# Patient Record
Sex: Female | Born: 1975
Health system: Southern US, Community
[De-identification: ages and names within clinical notes are randomized; demographics above are authoritative.]

## PROBLEM LIST (undated history)

## (undated) ENCOUNTER — Inpatient Hospital Stay (HOSPITAL_COMMUNITY): Admission: RE | Payer: BC Managed Care – PPO | Source: Ambulatory Visit

## (undated) DIAGNOSIS — D563 Thalassemia minor: Secondary | ICD-10-CM

## (undated) DIAGNOSIS — Z8619 Personal history of other infectious and parasitic diseases: Secondary | ICD-10-CM

## (undated) DIAGNOSIS — M1990 Unspecified osteoarthritis, unspecified site: Secondary | ICD-10-CM

## (undated) DIAGNOSIS — F419 Anxiety disorder, unspecified: Secondary | ICD-10-CM

## (undated) DIAGNOSIS — Z973 Presence of spectacles and contact lenses: Secondary | ICD-10-CM

## (undated) HISTORY — PX: FEMUR SURGERY: SHX943

## (undated) HISTORY — PX: HIP SURGERY: SHX245

## (undated) HISTORY — PX: KNEE SURGERY: SHX244

## (undated) HISTORY — DX: Personal history of other infectious and parasitic diseases: Z86.19

## (undated) HISTORY — DX: Thalassemia minor: D56.3

## (undated) HISTORY — PX: BARTHOLIN CYST MARSUPIALIZATION: SHX5383

---

## 2012-01-21 ENCOUNTER — Encounter (HOSPITAL_COMMUNITY): Payer: Self-pay | Admitting: Anesthesiology

## 2012-01-21 ENCOUNTER — Other Ambulatory Visit: Payer: Self-pay | Admitting: Obstetrics & Gynecology

## 2012-01-21 ENCOUNTER — Encounter (HOSPITAL_COMMUNITY)
Admission: RE | Disposition: A | Discharge status: Home / Self Care | Payer: Self-pay | Source: Ambulatory Visit | Attending: Obstetrics & Gynecology

## 2012-01-21 ENCOUNTER — Ambulatory Visit (HOSPITAL_COMMUNITY): Payer: BC Managed Care – PPO | Admitting: Anesthesiology

## 2012-01-21 ENCOUNTER — Encounter (HOSPITAL_COMMUNITY): Payer: Self-pay | Admitting: *Deleted

## 2012-01-21 ENCOUNTER — Ambulatory Visit (HOSPITAL_COMMUNITY)
Admission: RE | Admit: 2012-01-21 | Discharge: 2012-01-21 | Disposition: A | Discharge status: Home / Self Care | Payer: BC Managed Care – PPO | Source: Ambulatory Visit | Attending: Obstetrics & Gynecology | Admitting: Obstetrics & Gynecology

## 2012-01-21 DIAGNOSIS — O021 Missed abortion: Secondary | ICD-10-CM | POA: Insufficient documentation

## 2012-01-21 HISTORY — PX: DILATION AND EVACUATION: SHX1459

## 2012-01-21 LAB — CBC
HCT: 33.7 % — ABNORMAL LOW (ref 36.0–46.0)
Hemoglobin: 11 g/dL — ABNORMAL LOW (ref 12.0–15.0)
MCH: 20.4 pg — ABNORMAL LOW (ref 26.0–34.0)
MCV: 62.4 fL — ABNORMAL LOW (ref 78.0–100.0)
RBC: 5.4 MIL/uL — ABNORMAL HIGH (ref 3.87–5.11)

## 2012-01-21 SURGERY — DILATION AND EVACUATION, UTERUS
Anesthesia: Monitor Anesthesia Care

## 2012-01-21 MED ORDER — METHYLERGONOVINE MALEATE 0.2 MG/ML IJ SOLN
INTRAMUSCULAR | Status: DC | PRN
  Administered 2012-01-21: 0.2 mg via INTRAMUSCULAR

## 2012-01-21 MED ORDER — FENTANYL CITRATE 0.05 MG/ML IJ SOLN
INTRAMUSCULAR | Status: AC
  Filled 2012-01-21: qty 2

## 2012-01-21 MED ORDER — FENTANYL CITRATE 0.05 MG/ML IJ SOLN: 25.0000 ug | INTRAMUSCULAR | Status: DC | PRN

## 2012-01-21 MED ORDER — LIDOCAINE HCL (CARDIAC) 20 MG/ML IV SOLN
INTRAVENOUS | Status: AC
  Filled 2012-01-21: qty 5

## 2012-01-21 MED ORDER — METHYLERGONOVINE MALEATE 0.2 MG PO TABS: 0.2000 mg | ORAL_TABLET | Freq: Two times a day (BID) | ORAL | Status: DC | PRN

## 2012-01-21 MED ORDER — LIDOCAINE HCL (CARDIAC) 20 MG/ML IV SOLN
INTRAVENOUS | Status: DC | PRN
  Administered 2012-01-21: 50 mg via INTRAVENOUS

## 2012-01-21 MED ORDER — LACTATED RINGERS IV SOLN
INTRAVENOUS | Status: DC
  Administered 2012-01-21: 14:00:00 via INTRAVENOUS

## 2012-01-21 MED ORDER — MEPERIDINE HCL 25 MG/ML IJ SOLN: 6.2500 mg | INTRAMUSCULAR | Status: DC | PRN

## 2012-01-21 MED ORDER — MIDAZOLAM HCL 5 MG/5ML IJ SOLN
INTRAMUSCULAR | Status: DC | PRN
  Administered 2012-01-21: 2 mg via INTRAVENOUS

## 2012-01-21 MED ORDER — IBUPROFEN 200 MG PO TABS: 600.0000 mg | ORAL_TABLET | Freq: Four times a day (QID) | ORAL | Status: AC | PRN

## 2012-01-21 MED ORDER — MIDAZOLAM HCL 2 MG/2ML IJ SOLN
INTRAMUSCULAR | Status: AC
  Filled 2012-01-21: qty 2

## 2012-01-21 MED ORDER — PROPOFOL 10 MG/ML IV EMUL
INTRAVENOUS | Status: AC
  Filled 2012-01-21: qty 40

## 2012-01-21 MED ORDER — CHLOROPROCAINE HCL 1 % IJ SOLN
INTRAMUSCULAR | Status: AC
  Filled 2012-01-21: qty 30

## 2012-01-21 MED ORDER — KETOROLAC TROMETHAMINE 30 MG/ML IJ SOLN
INTRAMUSCULAR | Status: DC | PRN
  Administered 2012-01-21: 30 mg via INTRAVENOUS

## 2012-01-21 MED ORDER — MIDAZOLAM HCL 2 MG/2ML IJ SOLN: 0.5000 mg | Freq: Once | INTRAMUSCULAR | Status: DC | PRN

## 2012-01-21 MED ORDER — ONDANSETRON HCL 4 MG/2ML IJ SOLN
INTRAMUSCULAR | Status: DC | PRN
  Administered 2012-01-21: 4 mg via INTRAVENOUS

## 2012-01-21 MED ORDER — ONDANSETRON HCL 4 MG/2ML IJ SOLN
INTRAMUSCULAR | Status: AC
  Filled 2012-01-21: qty 2

## 2012-01-21 MED ORDER — FENTANYL CITRATE 0.05 MG/ML IJ SOLN
INTRAMUSCULAR | Status: DC | PRN
  Administered 2012-01-21: 50 ug via INTRAVENOUS

## 2012-01-21 MED ORDER — KETOROLAC TROMETHAMINE 30 MG/ML IJ SOLN: 15.0000 mg | Freq: Once | INTRAMUSCULAR | Status: DC | PRN

## 2012-01-21 MED ORDER — PROMETHAZINE HCL 25 MG/ML IJ SOLN: 6.2500 mg | INTRAMUSCULAR | Status: DC | PRN

## 2012-01-21 MED ORDER — PROPOFOL 10 MG/ML IV EMUL
INTRAVENOUS | Status: DC | PRN
  Administered 2012-01-21 (×3): 40 mg via INTRAVENOUS
  Administered 2012-01-21 (×2): 80 mg via INTRAVENOUS
  Administered 2012-01-21: 40 mg via INTRAVENOUS

## 2012-01-21 MED ORDER — OXYCODONE-ACETAMINOPHEN 5-325 MG PO TABS: 1.0000 | ORAL_TABLET | Freq: Four times a day (QID) | ORAL | Status: AC | PRN

## 2012-01-21 MED ORDER — CEFAZOLIN SODIUM 1-5 GM-% IV SOLN
1.0000 g | INTRAVENOUS | Status: AC
  Administered 2012-01-21: 1 g via INTRAVENOUS

## 2012-01-21 MED ORDER — ACETAMINOPHEN 325 MG PO TABS: 325.0000 mg | ORAL_TABLET | ORAL | Status: DC | PRN

## 2012-01-21 SURGICAL SUPPLY — 19 items
CATH ROBINSON RED A/P 16FR (CATHETERS) ×2 IMPLANT
CLOTH BEACON ORANGE TIMEOUT ST (SAFETY) ×2 IMPLANT
DECANTER SPIKE VIAL GLASS SM (MISCELLANEOUS) ×2 IMPLANT
GLOVE BIO SURGEON STRL SZ7 (GLOVE) ×2 IMPLANT
GLOVE BIOGEL PI IND STRL 7.0 (GLOVE) ×2 IMPLANT
GLOVE BIOGEL PI INDICATOR 7.0 (GLOVE) ×2
GOWN PREVENTION PLUS LG XLONG (DISPOSABLE) ×2 IMPLANT
KIT BERKELEY 1ST TRIMESTER 3/8 (MISCELLANEOUS) ×2 IMPLANT
NEEDLE SPNL 22GX3.5 QUINCKE BK (NEEDLE) ×2 IMPLANT
NS IRRIG 1000ML POUR BTL (IV SOLUTION) ×2 IMPLANT
PACK VAGINAL MINOR WOMEN LF (CUSTOM PROCEDURE TRAY) ×2 IMPLANT
PAD PREP 24X48 CUFFED NSTRL (MISCELLANEOUS) ×2 IMPLANT
SET BERKELEY SUCTION TUBING (SUCTIONS) ×2 IMPLANT
SYR CONTROL 10ML LL (SYRINGE) ×2 IMPLANT
TOWEL OR 17X24 6PK STRL BLUE (TOWEL DISPOSABLE) ×4 IMPLANT
VACURETTE 10 RIGID CVD (CANNULA) IMPLANT
VACURETTE 7MM CVD STRL WRAP (CANNULA) IMPLANT
VACURETTE 8 RIGID CVD (CANNULA) IMPLANT
VACURETTE 9 RIGID CVD (CANNULA) IMPLANT

## 2012-01-21 NOTE — Op Note (Signed)
Preoperative diagnosis: Missed abortion 9 wks, anechoic shadows in fetal lungs Postop diagnosis: as above.  Procedure: Suction D&E Anesthesia General/ MAC. Surgeon: Shea Evans, MD Assistant: none  IV fluids : LR 1 lit Estimated blood loss  150 cc Urine output:  straight catheter preop Complications none Condition stable Disposition PACU  Specimen: products of conception sent to pathology and for chromosomal analysis.   Procedure:  Indication: 9 wks Missed abortion diagnosed in office today.  Decision was made to proceed with suction D&E. Risks/ complications of surgery including infection, bleeding, damage to internal organs including uterine perforation, Asherman syndrome were reviewed. She voiced understanding and gave informed written consent.  Patient was brought to the operating room with IV running. She received preop  1 gm Ancef.  She underwent general endotracheal anesthesia without complications. She was given dorsolithotomy position. Parts were prepped and draped in standard fashion. Bladder was catheterized once.  Bimanual exam revealed uterus to be anteverted and 10 week size.  Speculum was placed and cervix was grasped with single-tooth tenaculum. Paracervical block given with 10 cc 1% Nesacaine. Cervical os was dilated to 25  [place] dilator. A # 8 suction curette was introduced in the uterine cavity and suction evacuation of products of conception was done in multiple step wise fashion. Gentle sharp curettage was performed. Suction cannula was reintroduced. Uterine cavity felt boggy and 0.2 mg IM Methergine given. Bleeding was well controlled.  Procedure was complete.  All counts are correct x2.  No complications. Patient was made supine dorsal anesthesia and brought to the recovery room in stable condition.  Patient will be discharged home today. Dc meds Percocet, methergine PO(PRN 2-3 days)  Dc follow up in 2 weeks in office. Warning signs of infection and excessive bleeding  reviewed.  V.Senon Nixon, MD.

## 2012-01-21 NOTE — Discharge Instructions (Signed)
DISCHARGE INSTRUCTIONS: D&C / D&E The following instructions have been prepared to help you care for yourself upon your return home.  MAY TAKE IBUPROFEN AFTER 10:35PM IF NEEDED FOR CRAMPS   Personal hygiene: Marland Kitchen Use sanitary pads for vaginal drainage, not tampons. . Shower the day after your procedure. . NO tub baths, pools or Jacuzzis for 2-3 weeks. . Wipe front to back after using the bathroom.  Activity and limitations: . Do NOT drive or operate any equipment for 24 hours. The effects of anesthesia are still present and drowsiness may result. . Do NOT rest in bed all day. . Walking is encouraged. . Walk up and down stairs slowly. . You may resume your normal activity in one to two days or as indicated by your physician.  Sexual activity: NO intercourse for at least 2 weeks after the procedure, or as indicated by your physician.  Diet: Eat a light meal as desired this evening. You may resume your usual diet tomorrow.  Return to work: You may resume your work activities in one to two days or as indicated by your doctor.  What to expect after your surgery: Expect to have vaginal bleeding/discharge for 2-3 days and spotting for up to 10 days. It is not unusual to have soreness for up to 1-2 weeks. You may have a slight burning sensation when you urinate for the first day. Mild cramps may continue for a couple of days. You may have a regular period in 2-6 weeks.  Call your doctor for any of the following: . Excessive vaginal bleeding, saturating and changing one pad every hour. . Inability to urinate 6 hours after discharge from hospital. . Pain not relieved by pain medication. . Fever of 100.4 F or greater. . Unusual vaginal discharge or odor.  Post Anesthesia Care Unit [phone]

## 2012-01-21 NOTE — H&P (Signed)
Raven Phillips is an 36 y.o. female. Here for DE for missed abortion at 9 wks.  Has one full term baby 61 yo, then SAB for blighted ovum in Jul'12. No Gyn probl, no STDs, no ABn Paps, no breast complaints.   History reviewed. No pertinent past medical history.  Past Surgical History  Procedure Date  . Femur surgery     childhood  . Bartholin cyst marsupialization    History reviewed. No pertinent family history.  Social History:  does not have a smoking history on file. She does not have any smokeless tobacco history on file. She reports that she does not drink alcohol or use illicit drugs.  Allergies: No Known Allergies  No prescriptions prior to admission    Review of Systems  Constitutional: Negative for fever.  Eyes: Negative for blurred vision.  Gastrointestinal: Negative for heartburn.  Neurological: Negative for dizziness.    Blood pressure 149/99, pulse 81, temperature 98.6 F (37 C), temperature source Oral, resp. rate 18, height 5\' 3"  (1.6 m), weight 56.7 kg (125 lb), SpO2 100.00%. Physical Exam  A&O x 3, no acute distress. Pleasant HEENT neg, no thyromegaly Lungs CTA bilat CV RRR, S1S2 normal Abdo soft, non tender, non acute Extr no edema/ tenderness Pelvic def FHT  Absent on sono    Results for orders placed during the hospital encounter of 01/21/12 (from the past 24 hour(s))  CBC     Status: Abnormal   Collection Time   01/21/12  2:03 PM      Component Value Range   WBC 10.7 (*) 4.0 - 10.5 (K/uL)   RBC 5.40 (*) 3.87 - 5.11 (MIL/uL)   Hemoglobin 11.0 (*) 12.0 - 15.0 (g/dL)   HCT 16.1 (*) 09.6 - 46.0 (%)   MCV 62.4 (*) 78.0 - 100.0 (fL)   MCH 20.4 (*) 26.0 - 34.0 (pg)   MCHC 32.6  30.0 - 36.0 (g/dL)   RDW 04.5  40.9 - 81.1 (%)   Platelets 240  150 - 400 (K/uL)    No results found.  Assessment/Plan: 9 wks, Missed abortion, needs dilatation, evacuation. Plan to send for chromosomal analysis.   Risks/complications of surgery reviewed incl  infection, bleeding, damage to internal organs including bladder, bowels, ureters, blood vessels, other risks from anesthesia, VTE and delayed complications of any surgery, complications in future surgery reviewed.   Leiam Hopwood R 01/21/2012, 3:52 PM

## 2012-01-21 NOTE — Transfer of Care (Signed)
Immediate Anesthesia Transfer of Care Note  Patient: Raven Phillips  Procedure(s) Performed: Procedure(s) (LRB): DILATATION AND EVACUATION (N/A)  Patient Location: PACU  Anesthesia Type: MAC  Level of Consciousness: awake, alert  and oriented  Airway & Oxygen Therapy: Patient Spontanous Breathing  Post-op Assessment: Report given to PACU RN and Post -op Vital signs reviewed and stable  Post vital signs: Reviewed and stable  Complications: No apparent anesthesia complications

## 2012-01-21 NOTE — Anesthesia Preprocedure Evaluation (Signed)
Anesthesia Evaluation  Patient identified by MRN, date of birth, ID band Patient awake    Reviewed: Allergy & Precautions, H&P , Patient's Chart, lab work & pertinent test results, reviewed documented beta blocker date and time   History of Anesthesia Complications Negative for: history of anesthetic complications  Airway Mallampati: II TM Distance: >3 FB Neck ROM: full    Dental No notable dental hx.    Pulmonary neg pulmonary ROS,  breath sounds clear to auscultation  Pulmonary exam normal       Cardiovascular Exercise Tolerance: Good negative cardio ROS  Rhythm:regular Rate:Normal     Neuro/Psych negative neurological ROS  negative psych ROS   GI/Hepatic negative GI ROS, Neg liver ROS,   Endo/Other  negative endocrine ROS  Renal/GU negative Renal ROS     Musculoskeletal   Abdominal   Peds  Hematology negative hematology ROS (+)   Anesthesia Other Findings   Reproductive/Obstetrics negative OB ROS                           Anesthesia Physical Anesthesia Plan  ASA: I  Anesthesia Plan: MAC   Post-op Pain Management:    Induction:   Airway Management Planned:   Additional Equipment:   Intra-op Plan:   Post-operative Plan:   Informed Consent: I have reviewed the patients History and Physical, chart, labs and discussed the procedure including the risks, benefits and alternatives for the proposed anesthesia with the patient or authorized representative who has indicated his/her understanding and acceptance.   Dental Advisory Given  Plan Discussed with: CRNA, Surgeon and Anesthesiologist  Anesthesia Plan Comments:       Anesthesia Quick Evaluation  

## 2012-01-21 NOTE — Anesthesia Postprocedure Evaluation (Signed)
Anesthesia Post Note  Patient: Raven Phillips  Procedure(s) Performed: Procedure(s) (LRB): DILATATION AND EVACUATION (N/A)  Anesthesia type: MAC  Patient location: PACU  Post pain: Pain level controlled  Post assessment: Post-op Vital signs reviewed  Last Vitals:  Filed Vitals:   01/21/12 1730  BP: 105/75  Pulse: 61  Temp: 37.1 C  Resp: 16    Post vital signs: Reviewed  Level of consciousness: sedated  Complications: No apparent anesthesia complications

## 2012-01-22 ENCOUNTER — Encounter (HOSPITAL_COMMUNITY): Payer: Self-pay | Admitting: Obstetrics & Gynecology

## 2012-02-02 LAB — CHROMOSOME STD, POC(TISSUE)-NCBH

## 2012-02-09 DEATH — deceased

## 2012-06-03 LAB — OB RESULTS CONSOLE GC/CHLAMYDIA: Chlamydia: NEGATIVE

## 2012-06-17 LAB — OB RESULTS CONSOLE ABO/RH: RH Type: POSITIVE

## 2012-06-17 LAB — OB RESULTS CONSOLE RUBELLA ANTIBODY, IGM: Rubella: IMMUNE

## 2012-06-17 LAB — OB RESULTS CONSOLE HIV ANTIBODY (ROUTINE TESTING): HIV: NONREACTIVE

## 2012-06-17 LAB — OB RESULTS CONSOLE HEPATITIS B SURFACE ANTIGEN: Hepatitis B Surface Ag: NEGATIVE

## 2012-11-06 ENCOUNTER — Inpatient Hospital Stay (HOSPITAL_COMMUNITY)
Admission: AD | Admit: 2012-11-06 | Discharge: 2012-11-06 | Disposition: A | Discharge status: Home / Self Care | Payer: BC Managed Care – PPO | Source: Ambulatory Visit | Attending: Obstetrics | Admitting: Obstetrics

## 2012-11-06 ENCOUNTER — Encounter (HOSPITAL_COMMUNITY): Payer: Self-pay | Admitting: *Deleted

## 2012-11-06 DIAGNOSIS — O479 False labor, unspecified: Secondary | ICD-10-CM | POA: Insufficient documentation

## 2012-11-06 DIAGNOSIS — O99891 Other specified diseases and conditions complicating pregnancy: Secondary | ICD-10-CM | POA: Insufficient documentation

## 2012-11-06 LAB — AMNISURE RUPTURE OF MEMBRANE (ROM) NOT AT ARMC: Amnisure ROM: NEGATIVE

## 2012-11-06 NOTE — MAU Provider Note (Signed)
  History   Patient had called the answering service and was advised to come to hospital. Contacted the patient directly and patient gave good story of rupture. Advised to come to MAU for Evaluation of possible SROM. G 4p1021 at [redacted]w[redacted]d.   CSN: [id number]  Arrival date and time: 11/06/12 1159   None     Chief Complaint  Patient presents with  . Rupture of Membranes   HPI  OB History    Grav Para Term Preterm Abortions TAB SAB Ect Mult Living   4 1 1  0 2 0 2 0 0 1      History reviewed. No pertinent past medical history.  Past Surgical History  Procedure Date  . Femur surgery     childhood  . Bartholin cyst marsupialization   . Dilation and evacuation 01/21/2012    Procedure: DILATATION AND EVACUATION;  Surgeon: Robley Fries, MD;  Location: [facility];  Service: Gynecology;  Laterality: N/A;  chromosome studies    History reviewed. No pertinent family history.  History  Substance Use Topics  . Smoking status: Never Smoker   . Smokeless tobacco: Not on file  . Alcohol Use: No    Allergies: No Known Allergies  Prescriptions prior to admission  Medication Sig Dispense Refill  . methylergonovine (METHERGINE) 0.2 MG tablet Take 1 tablet (0.2 mg total) by mouth every 12 (twelve) hours as needed.  6 tablet  0    Review of Systems  Constitutional: Negative.   HENT: Negative.   Eyes: Negative.   Respiratory: Negative.   Cardiovascular: Negative.   Gastrointestinal: Negative.   Genitourinary: Negative.        Possible rupture of membranes. Evaluation.  Musculoskeletal: Negative.   Skin: Negative.        Multiple scars noted on legs baterally from orthopedic surgery to fix Hip Dyplasia.  Neurological: Negative.   Endo/Heme/Allergies: Negative.   Psychiatric/Behavioral: Negative.    Physical Exam   Blood pressure 131/75, pulse 101, temperature 98.2 F (36.8 C), temperature source Oral, resp. rate 18, height 5\' 3"  (1.6 m), weight 70.035 kg (154 lb 6.4  oz).  Physical Exam  Constitutional: She is oriented to person, place, and time. She appears well-developed and well-nourished.  HENT:  Head: Normocephalic and atraumatic.  Eyes: Conjunctivae normal and EOM are normal. Pupils are equal, round, and reactive to light.  Neck: Normal range of motion. Neck supple.  Cardiovascular: Normal rate and regular rhythm.   Respiratory: Effort normal and breath sounds normal.  GI: Soft. Bowel sounds are normal.  Genitourinary: Vagina normal and uterus normal.       Possible SROM and Amni-sure performed.  Musculoskeletal: Normal range of motion.  Neurological: She is alert and oriented to person, place, and time. She has normal reflexes.  Skin: Skin is warm and dry.  Psychiatric: She has a normal mood and affect.    MAU Course  Procedures EFM: 145 bpm baseline, Cat 1 Tracing. Contracting 1: 2 -3 mins. Amnio-sure:Negative.  Assessment and Plan  Patient is not ruptured with negative Amni-sure. Patient is contracting 1: 2 -3 mins c/w with BH Ctx as patient is not distressed. Has a scheduled appointment with Dr. Juliene Pina on 11/12/12.  Eather Chaires, CNM. 11/06/2012, 12:45 PM

## 2012-11-06 NOTE — MAU Note (Signed)
Pt reports she thinks her water broke. Pajama bottoms where wet ans still having some fluid come out. A Few mild contractions and good fetal movement reproted.

## 2012-11-10 NOTE — L&D Delivery Note (Signed)
Delivery Note At 5:31 PM a viable and healthy female was delivered via Vaginal, Spontaneous Delivery (Presentation: Right Occiput Anterior).  APGAR: 9, 9; weight - pending Placenta status: Intact, Spontaneous.  Cord: 3 vessels with the following complications: None.  Cord pH: n/a  Anesthesia: Epidural  Episiotomy: None Lacerations: 1st degree Suture Repair: 3.0 vicryl rapide Est. Blood Loss (mL): 300 cc  Mom to postpartum.  Baby to nursery-stable.  Dacen Frayre R 11/22/2012, 6:06 PM

## 2012-11-19 ENCOUNTER — Other Ambulatory Visit: Payer: Self-pay | Admitting: Obstetrics & Gynecology

## 2012-11-22 ENCOUNTER — Inpatient Hospital Stay (HOSPITAL_COMMUNITY): Payer: BC Managed Care – PPO | Admitting: Anesthesiology

## 2012-11-22 ENCOUNTER — Encounter (HOSPITAL_COMMUNITY): Payer: Self-pay

## 2012-11-22 ENCOUNTER — Encounter (HOSPITAL_COMMUNITY): Payer: Self-pay | Admitting: Anesthesiology

## 2012-11-22 ENCOUNTER — Inpatient Hospital Stay (HOSPITAL_COMMUNITY)
Admission: RE | Admit: 2012-11-22 | Discharge: 2012-11-24 | DRG: 373 | Disposition: A | Discharge status: Home / Self Care | Payer: BC Managed Care – PPO | Source: Ambulatory Visit | Attending: Obstetrics & Gynecology | Admitting: Obstetrics & Gynecology

## 2012-11-22 DIAGNOSIS — O9903 Anemia complicating the puerperium: Secondary | ICD-10-CM | POA: Diagnosis not present

## 2012-11-22 DIAGNOSIS — O99892 Other specified diseases and conditions complicating childbirth: Secondary | ICD-10-CM | POA: Diagnosis present

## 2012-11-22 DIAGNOSIS — O09529 Supervision of elderly multigravida, unspecified trimester: Secondary | ICD-10-CM | POA: Diagnosis present

## 2012-11-22 DIAGNOSIS — O021 Missed abortion: Secondary | ICD-10-CM

## 2012-11-22 DIAGNOSIS — D62 Acute posthemorrhagic anemia: Secondary | ICD-10-CM | POA: Diagnosis not present

## 2012-11-22 DIAGNOSIS — M25559 Pain in unspecified hip: Secondary | ICD-10-CM | POA: Diagnosis present

## 2012-11-22 LAB — CBC
MCH: 22 pg — ABNORMAL LOW (ref 26.0–34.0)
MCHC: 34.3 g/dL (ref 30.0–36.0)
Platelets: 235 10*3/uL (ref 150–400)

## 2012-11-22 LAB — TYPE AND SCREEN: ABO/RH(D): O POS

## 2012-11-22 LAB — ABO/RH: ABO/RH(D): O POS

## 2012-11-22 MED ORDER — LIDOCAINE HCL (PF) 1 % IJ SOLN: 30.0000 mL | INTRAMUSCULAR | Status: DC | PRN

## 2012-11-22 MED ORDER — CITRIC ACID-SODIUM CITRATE 334-500 MG/5ML PO SOLN: 30.0000 mL | ORAL | Status: DC | PRN

## 2012-11-22 MED ORDER — WITCH HAZEL-GLYCERIN EX PADS: 1.0000 "application " | MEDICATED_PAD | CUTANEOUS | Status: DC | PRN

## 2012-11-22 MED ORDER — BUTORPHANOL TARTRATE 1 MG/ML IJ SOLN
1.0000 mg | INTRAMUSCULAR | Status: DC | PRN
  Administered 2012-11-22: 1 mg via INTRAVENOUS
  Filled 2012-11-22: qty 1

## 2012-11-22 MED ORDER — OXYCODONE-ACETAMINOPHEN 5-325 MG PO TABS: 1.0000 | ORAL_TABLET | ORAL | Status: DC | PRN

## 2012-11-22 MED ORDER — LACTATED RINGERS IV SOLN: 500.0000 mL | INTRAVENOUS | Status: DC | PRN

## 2012-11-22 MED ORDER — LIDOCAINE HCL (PF) 1 % IJ SOLN
INTRAMUSCULAR | Status: DC | PRN
  Administered 2012-11-22 (×4): 4 mL

## 2012-11-22 MED ORDER — TETANUS-DIPHTH-ACELL PERTUSSIS 5-2.5-18.5 LF-MCG/0.5 IM SUSP: 0.5000 mL | Freq: Once | INTRAMUSCULAR | Status: DC

## 2012-11-22 MED ORDER — SIMETHICONE 80 MG PO CHEW: 80.0000 mg | CHEWABLE_TABLET | ORAL | Status: DC | PRN

## 2012-11-22 MED ORDER — IBUPROFEN 600 MG PO TABS: 600.0000 mg | ORAL_TABLET | Freq: Four times a day (QID) | ORAL | Status: DC | PRN

## 2012-11-22 MED ORDER — IBUPROFEN 600 MG PO TABS
600.0000 mg | ORAL_TABLET | Freq: Four times a day (QID) | ORAL | Status: DC
  Administered 2012-11-22 – 2012-11-24 (×6): 600 mg via ORAL
  Filled 2012-11-22 (×7): qty 1

## 2012-11-22 MED ORDER — DIPHENHYDRAMINE HCL 25 MG PO CAPS: 25.0000 mg | ORAL_CAPSULE | Freq: Four times a day (QID) | ORAL | Status: DC | PRN

## 2012-11-22 MED ORDER — ONDANSETRON HCL 4 MG/2ML IJ SOLN: 4.0000 mg | Freq: Four times a day (QID) | INTRAMUSCULAR | Status: DC | PRN

## 2012-11-22 MED ORDER — OXYTOCIN 40 UNITS IN LACTATED RINGERS INFUSION - SIMPLE MED
1.0000 m[IU]/min | INTRAVENOUS | Status: DC
  Administered 2012-11-22: 2 m[IU]/min via INTRAVENOUS
  Filled 2012-11-22: qty 1000

## 2012-11-22 MED ORDER — LANOLIN HYDROUS EX OINT: TOPICAL_OINTMENT | CUTANEOUS | Status: DC | PRN

## 2012-11-22 MED ORDER — OXYTOCIN BOLUS FROM INFUSION: 500.0000 mL | INTRAVENOUS | Status: DC

## 2012-11-22 MED ORDER — PHENYLEPHRINE 40 MCG/ML (10ML) SYRINGE FOR IV PUSH (FOR BLOOD PRESSURE SUPPORT): 80.0000 ug | PREFILLED_SYRINGE | INTRAVENOUS | Status: DC | PRN

## 2012-11-22 MED ORDER — ZOLPIDEM TARTRATE 5 MG PO TABS: 5.0000 mg | ORAL_TABLET | Freq: Every evening | ORAL | Status: DC | PRN

## 2012-11-22 MED ORDER — SENNOSIDES-DOCUSATE SODIUM 8.6-50 MG PO TABS
2.0000 | ORAL_TABLET | Freq: Every day | ORAL | Status: DC
  Administered 2012-11-23: 2 via ORAL

## 2012-11-22 MED ORDER — FENTANYL 2.5 MCG/ML BUPIVACAINE 1/10 % EPIDURAL INFUSION (WH - ANES)
14.0000 mL/h | INTRAMUSCULAR | Status: DC
  Administered 2012-11-22: 14 mL/h via EPIDURAL
  Filled 2012-11-22: qty 125

## 2012-11-22 MED ORDER — FLEET ENEMA 7-19 GM/118ML RE ENEM: 1.0000 | ENEMA | RECTAL | Status: DC | PRN

## 2012-11-22 MED ORDER — ONDANSETRON HCL 4 MG PO TABS: 4.0000 mg | ORAL_TABLET | ORAL | Status: DC | PRN

## 2012-11-22 MED ORDER — PRENATAL MULTIVITAMIN CH
1.0000 | ORAL_TABLET | Freq: Every day | ORAL | Status: DC
  Administered 2012-11-22 – 2012-11-24 (×3): 1 via ORAL
  Filled 2012-11-22 (×2): qty 1

## 2012-11-22 MED ORDER — LACTATED RINGERS IV SOLN: 500.0000 mL | Freq: Once | INTRAVENOUS | Status: DC

## 2012-11-22 MED ORDER — DIPHENHYDRAMINE HCL 50 MG/ML IJ SOLN: 12.5000 mg | INTRAMUSCULAR | Status: DC | PRN

## 2012-11-22 MED ORDER — ONDANSETRON HCL 4 MG/2ML IJ SOLN: 4.0000 mg | INTRAMUSCULAR | Status: DC | PRN

## 2012-11-22 MED ORDER — PHENYLEPHRINE 40 MCG/ML (10ML) SYRINGE FOR IV PUSH (FOR BLOOD PRESSURE SUPPORT)
80.0000 ug | PREFILLED_SYRINGE | INTRAVENOUS | Status: DC | PRN
  Filled 2012-11-22: qty 5

## 2012-11-22 MED ORDER — TERBUTALINE SULFATE 1 MG/ML IJ SOLN: 0.2500 mg | Freq: Once | INTRAMUSCULAR | Status: DC | PRN

## 2012-11-22 MED ORDER — EPHEDRINE 5 MG/ML INJ
10.0000 mg | INTRAVENOUS | Status: DC | PRN
  Filled 2012-11-22: qty 4

## 2012-11-22 MED ORDER — BENZOCAINE-MENTHOL 20-0.5 % EX AERO
1.0000 "application " | INHALATION_SPRAY | CUTANEOUS | Status: DC | PRN
  Administered 2012-11-22: 1 via TOPICAL
  Filled 2012-11-22: qty 56

## 2012-11-22 MED ORDER — EPHEDRINE 5 MG/ML INJ: 10.0000 mg | INTRAVENOUS | Status: DC | PRN

## 2012-11-22 MED ORDER — OXYTOCIN 40 UNITS IN LACTATED RINGERS INFUSION - SIMPLE MED
62.5000 mL/h | INTRAVENOUS | Status: DC
Start: 2012-11-22 — End: 2012-11-22

## 2012-11-22 MED ORDER — LACTATED RINGERS IV SOLN
INTRAVENOUS | Status: DC
  Administered 2012-11-22 (×2): via INTRAVENOUS

## 2012-11-22 MED ORDER — ACETAMINOPHEN 325 MG PO TABS: 650.0000 mg | ORAL_TABLET | ORAL | Status: DC | PRN

## 2012-11-22 MED ORDER — DIBUCAINE 1 % RE OINT: 1.0000 "application " | TOPICAL_OINTMENT | RECTAL | Status: DC | PRN

## 2012-11-22 NOTE — H&P (Signed)
Raven Phillips is a 37 y.o. female [phone] at  39.2 wks here for labor IOL due to hip pain from hip dysplasia hx and s/p surgical correction.  No leaking/ bleeding. Good FMs and no UCs.  1st term preg, then 2 SAB,s w/up for 1st trim loss neg, took bb ASA in 1st trim,  [facility], no complications. Labs nl. Sono nl.   History OB History    Grav Para Term Preterm Abortions TAB SAB Ect Mult Living   4 1 1  0 2 0 2 0 0 1     No past medical history on file. Past Surgical History  Procedure Date  . Femur surgery     childhood  . Bartholin cyst marsupialization   . Dilation and evacuation 01/21/2012    Procedure: DILATATION AND EVACUATION;  Surgeon: Robley Fries, MD;  Location: [facility];  Service: Gynecology;  Laterality: N/A;  chromosome studies  . Hip surgery Childhood    For hip dysplasia  . Knee surgery childhhod   Family History: family history is not on file. Social History:  reports that she has never smoked. She has never used smokeless tobacco. She reports that she does not drink alcohol or use illicit drugs.   Prenatal Transfer Tool  Maternal Diabetes: No Genetic Screening: Normal- Ultrascreen negative, AFP1 nl.  Maternal Ultrasounds/Referrals: Normal Fetal Ultrasounds or other Referrals:  None Maternal Substance Abuse:  No Significant Maternal Medications:  Baby ASA in 1st trimester for prior 2 losses.  Significant Maternal Lab Results:  Lab values include: Group B Strep negative, Beta Thal- minor Other Comments:  Mother and 1st child had hip dysplasia at birth  ROS Hip pain(+), but no flank pain/ dysuria/ Cp/SOB/ HA.   Exam Physical Exam  Physical exam:  A&O x 3, no acute distress. Pleasant HEENT neg, no thyromegaly Lungs CTA bilat CV RRR, S1S2 normal Abdo soft, non tender, non acute Extr no edema/ tenderness. Cannot rotate/abduct left hip well Pelvic 2/50%/-3/Vtx FHT  130s/ + accels/ no decels/ mod variab- category I Toco none at admission  Prenatal  labs: ABO, Rh: --/--/O POS, O POS (01/13 0745) Antibody: NEG (01/13 0745) Rubella: Immune (08/08 0000) RPR: NON REACTIVE (01/13 0745)  HBsAg: Negative (08/08 0000)  HIV: Non-reactive (08/08 0000)  GBS: Negative (12/27 0000)  Glucola nl Anatomy sono nl Hemoglobin- Thal minor.   Assessment/Plan 37 yo, G4P1021 at 39.2 wks, IOL, pitocin. Epidural as desired. GBS neg. FHT category I Control legs during delivery since pt has h/o hip dysplasia since birth, s/p surgery but had a lot of pain after 1st delivery (pushed for 2+ hrs then) Mother is b-Thanl-minor    Odalis Jordan R 11/22/2012, 5:51 PM

## 2012-11-22 NOTE — Progress Notes (Signed)
Anicia Leuthold is a 37 y.o. [phone] at [redacted]w[redacted]d, IOL. GBS neg. Subjective: Feels some UCs, getting stronger, considering epidural   Objective: BP 131/69  Pulse 81  Temp 98.2 F (36.8 C) (Oral)  Resp 18  Ht 5\' 3"  (1.6 m)  Wt 157 lb (71.215 kg)  BMI 27.81 kg/m2  SpO2 98%   FHT:130s /+ accels/ no decels/ mod varia- category I  UC:   regular, every 3 minutes SVE:  3/ 50%/ -2/ Vtx. AROM, clear copious AF.  Assessment / Plan: Induction of labor due to Hip pain with prior hip dyaplasia,  progressing well on pitocin  Labor: Progressing normally Fetal Wellbeing:  Category I Pain Control:  Labor support without medications, epidural as desired Anticipated MOD:  NSVD  Gayleen Sholtz R 11/22/2012, 6:01 PM

## 2012-11-22 NOTE — Anesthesia Preprocedure Evaluation (Signed)

## 2012-11-22 NOTE — Progress Notes (Signed)
Care of pt turned over to Rogelio Seen, RN

## 2012-11-22 NOTE — Progress Notes (Signed)
Resumed care of pt from Rogelio Seen, RN

## 2012-11-22 NOTE — Anesthesia Procedure Notes (Addendum)
Epidural Patient location during procedure: OB Start time: 11/22/2012 3:30 PM  Staffing Performed by: anesthesiologist   Preanesthetic Checklist Completed: patient identified, site marked, surgical consent, pre-op evaluation, timeout performed, IV checked, risks and benefits discussed and monitors and equipment checked  Epidural Patient position: sitting Prep: site prepped and draped and DuraPrep Patient monitoring: continuous pulse ox and blood pressure Approach: midline Injection technique: LOR air  Needle:  Needle type: Tuohy  Needle gauge: 17 G Needle length: 9 cm and 9 Needle insertion depth: 5 cm cm Catheter type: closed end flexible Catheter size: 19 Gauge Catheter at skin depth: 10 cm Test dose: negative  Assessment Events: blood not aspirated, injection not painful, no injection resistance, negative IV test and no paresthesia  Additional Notes Discussed risk of headache, infection, bleeding, nerve injury and failed or incomplete block.  Patient voices understanding and wishes to proceed.  Epidural placed easily on first attempt.  Patient tolerated procedure well with no apparent complications. Reason for block:procedure for pain

## 2012-11-23 DIAGNOSIS — D62 Acute posthemorrhagic anemia: Secondary | ICD-10-CM | POA: Diagnosis not present

## 2012-11-23 LAB — CBC
HCT: 30.1 % — ABNORMAL LOW (ref 36.0–46.0)
Hemoglobin: 9.8 g/dL — ABNORMAL LOW (ref 12.0–15.0)
MCH: 21.6 pg — ABNORMAL LOW (ref 26.0–34.0)
MCHC: 32.6 g/dL (ref 30.0–36.0)

## 2012-11-23 NOTE — Anesthesia Postprocedure Evaluation (Signed)
  Anesthesia Post-op Note  Patient: Raven Phillips  Procedure(s) Performed: * No procedures listed *  Patient Location: Mother/Baby  Anesthesia Type:Epidural  Level of Consciousness: awake, alert  and oriented  Airway and Oxygen Therapy: Patient Spontanous Breathing  Post-op Pain: mild  Post-op Assessment: Patient's Cardiovascular Status Stable, Respiratory Function Stable, No headache, No backache, No residual numbness and No residual motor weakness  Post-op Vital Signs: stable  Complications: No apparent anesthesia complications

## 2012-11-23 NOTE — Progress Notes (Signed)
PPD 1 SVD  S:  Reports feeling well - slept after birth last evening             Tolerating po/ No nausea or vomiting             Bleeding is light this am - heavier last PM after birth for several hours             Pain controlled withprescription NSAID's including motrin and occasional percocet             Up ad lib / ambulatory  Newborn formula feeding     O:               VS: BP 107/67  Pulse 69  Temp 97.8 F (36.6 C) (Oral)  Resp 18  Ht 5\' 3"  (1.6 m)  Wt 71.215 kg (157 lb)  BMI 27.81 kg/m2  SpO2 97%  Breastfeeding? Unknown   LABS:  Basename 11/23/12 0555 11/22/12 0745  WBC 13.1* 10.4  HGB 9.8* 11.0*  PLT 156 235                   Physical Exam:             Alert and oriented X3  Lungs: Clear and unlabored  Heart: regular rate and rhythm / no mumurs  Abdomen: soft, non-tender, non-distended              Fundus: firm, non-tender, Fundus even  Perineum: mild edema / ice pack in place  Lochia: lighty  Extremities: trace edema, no calf pain or tenderness    A: PPD # 1              ABL associated with childbirth - hemodynamically stable  Doing well - stable status  P:  Routine post partum orders  Anticipate discharge in am   Marlinda Mike CNM, MSN 11/23/2012, 8:59 AM

## 2012-11-24 MED ORDER — IBUPROFEN 600 MG PO TABS: 600.0000 mg | ORAL_TABLET | Freq: Four times a day (QID) | ORAL | Status: DC

## 2012-11-24 NOTE — Progress Notes (Signed)
Patient ID: Raven Phillips, female   DOB: 1976-03-13, 37 y.o.   MRN: [id number] PPD # 2  Subjective: Pt reports feeling well and eager for d/c home/ Pain controlled with ibuprofen Tolerating po/ Voiding without problems/ No n/v Bleeding is light/ Newborn info:  Information for the patient's newborn:  Mckaylie, Vasey Girl Charlise [[id number]]  female Feeding: breast    Objective:  VS: Blood pressure 109/69, pulse 65, temperature 97.9 F (36.6 C), temperature source Oral, resp. rate 18.    Basename 11/23/12 0555 11/22/12 0745  WBC 13.1* 10.4  HGB 9.8* 11.0*  HCT 30.1* 32.1*  PLT 156 235    Blood type: --/--/O POS, O POS (01/13 0745) Rubella: Immune (08/08 0000)    Physical Exam:  General: A & O x 3  alert, cooperative and no distress CV: Regular rate and rhythm Resp: clear Abdomen: soft, nontender, normal bowel sounds Uterine Fundus: firm, below umbilicus, nontender Perineum: not inspected Lochia: minimal Ext: Homans sign is negative, no sign of DVT    A/P: PPD # 2/ [id number]/ S/P: SVD w/ 2nd deg repair Doing well and stable for discharge home RX: Ibuprofen 600mg  po Q 6 hrs prn pain #30 Refill x 1 WOB/GYN booklet given Routine pp visit in 6wks   Demetrius Revel, MSN, [facility] 11/24/2012, 9:33 AM

## 2012-11-24 NOTE — Discharge Summary (Signed)
Obstetric Discharge Summary Reason for Admission: 39w 2d for IOL; hx hip dysplasia since birth Prenatal Procedures: ultrasound Intrapartum Procedures: spontaneous vaginal delivery Postpartum Procedures: none Complications-Operative and Postpartum: 2nd degree perineal laceration Hemoglobin  Date Value Range Status  11/23/2012 9.8* 12.0 - 15.0 g/dL Final     HCT  Date Value Range Status  11/23/2012 30.1* 36.0 - 46.0 % Final    Physical Exam:  General: alert, cooperative and no distress Lochia: appropriate Uterine Fundus: firm Incision: n/a DVT Evaluation: No evidence of DVT seen on physical exam.  Discharge Diagnoses: SVD w/ 2nd degree lac  Discharge Information: Date: 11/24/2012 Activity: pelvic rest Diet: routine Medications: PNV and Ibuprofen Condition: stable Instructions: refer to practice specific booklet Discharge to: home Follow-up Information    Follow up with MODY,Raven Phillips, Raven Phillips. In 6 weeks.   Contact information:   [address] [place] [address] [phone]          Newborn Data: Live born female on 11/22/12 Birth Weight: 7 lb 5.1 oz (3320 g) APGAR: 9, 9  Home with mother.  Raven Phillips 11/24/2012, 9:41 AM

## 2012-11-25 NOTE — Discharge Summary (Signed)
Reviewed and agree with note and plan. V.Woodrow Drab, MD  

## 2013-11-10 NOTE — L&D Delivery Note (Signed)
Delivery Note At 4:43 PM a viable and healthy female was delivered via Vaginal, Spontaneous Delivery (Presentation: Right Occiput Anterior).  APGAR:9 , 9; weight  -pending   Placenta status: Intact, Spontaneous.  Cord: 3 vessels with the following complications: None.  Cord pH: n/a  Anesthesia: Epidural  Episiotomy: None Lacerations: 1st degree Suture Repair: 3.0 vicryl rapide Est. Blood Loss (mL): 400  Mom to postpartum.  Baby to Couplet care / Skin to Skin. Grunting resolved with skin/skin, kept nl O2 sats.   Luiza Carranco R 09/14/2014, 5:27 PM

## 2014-03-15 LAB — OB RESULTS CONSOLE ANTIBODY SCREEN: ANTIBODY SCREEN: NEGATIVE

## 2014-03-15 LAB — OB RESULTS CONSOLE HIV ANTIBODY (ROUTINE TESTING): HIV: NONREACTIVE

## 2014-03-15 LAB — OB RESULTS CONSOLE ABO/RH: RH Type: POSITIVE

## 2014-03-15 LAB — OB RESULTS CONSOLE RPR: RPR: NONREACTIVE

## 2014-03-15 LAB — OB RESULTS CONSOLE HEPATITIS B SURFACE ANTIGEN: Hepatitis B Surface Ag: NEGATIVE

## 2014-03-15 LAB — OB RESULTS CONSOLE RUBELLA ANTIBODY, IGM: Rubella: IMMUNE

## 2014-03-22 LAB — OB RESULTS CONSOLE GC/CHLAMYDIA
Chlamydia: NEGATIVE
Gonorrhea: NEGATIVE

## 2014-04-07 LAB — OB RESULTS CONSOLE GBS: GBS: POSITIVE

## 2014-09-08 ENCOUNTER — Other Ambulatory Visit: Payer: Self-pay | Admitting: Obstetrics & Gynecology

## 2014-09-08 ENCOUNTER — Telehealth (HOSPITAL_COMMUNITY): Payer: Self-pay | Admitting: *Deleted

## 2014-09-08 ENCOUNTER — Encounter (HOSPITAL_COMMUNITY): Payer: Self-pay | Admitting: *Deleted

## 2014-09-08 NOTE — Telephone Encounter (Signed)
Preadmission screen  

## 2014-09-11 ENCOUNTER — Encounter (HOSPITAL_COMMUNITY): Payer: Self-pay | Admitting: *Deleted

## 2014-09-14 ENCOUNTER — Encounter (HOSPITAL_COMMUNITY): Payer: Self-pay

## 2014-09-14 ENCOUNTER — Inpatient Hospital Stay (HOSPITAL_COMMUNITY): Payer: BC Managed Care – PPO | Admitting: Anesthesiology

## 2014-09-14 ENCOUNTER — Inpatient Hospital Stay (HOSPITAL_COMMUNITY)
Admission: RE | Admit: 2014-09-14 | Discharge: 2014-09-16 | DRG: 775 | Disposition: A | Discharge status: Home / Self Care | Payer: BC Managed Care – PPO | Source: Ambulatory Visit | Attending: Obstetrics & Gynecology | Admitting: Obstetrics & Gynecology

## 2014-09-14 DIAGNOSIS — Q6589 Other specified congenital deformities of hip: Secondary | ICD-10-CM | POA: Diagnosis not present

## 2014-09-14 DIAGNOSIS — O9902 Anemia complicating childbirth: Secondary | ICD-10-CM | POA: Diagnosis present

## 2014-09-14 DIAGNOSIS — Z8249 Family history of ischemic heart disease and other diseases of the circulatory system: Secondary | ICD-10-CM | POA: Diagnosis not present

## 2014-09-14 DIAGNOSIS — Z3A39 39 weeks gestation of pregnancy: Secondary | ICD-10-CM | POA: Diagnosis present

## 2014-09-14 DIAGNOSIS — O99824 Streptococcus B carrier state complicating childbirth: Secondary | ICD-10-CM | POA: Diagnosis present

## 2014-09-14 DIAGNOSIS — Z823 Family history of stroke: Secondary | ICD-10-CM | POA: Diagnosis not present

## 2014-09-14 DIAGNOSIS — O9989 Other specified diseases and conditions complicating pregnancy, childbirth and the puerperium: Secondary | ICD-10-CM | POA: Diagnosis present

## 2014-09-14 DIAGNOSIS — Z833 Family history of diabetes mellitus: Secondary | ICD-10-CM

## 2014-09-14 DIAGNOSIS — O09523 Supervision of elderly multigravida, third trimester: Secondary | ICD-10-CM | POA: Diagnosis not present

## 2014-09-14 DIAGNOSIS — D62 Acute posthemorrhagic anemia: Secondary | ICD-10-CM | POA: Diagnosis present

## 2014-09-14 DIAGNOSIS — M25559 Pain in unspecified hip: Secondary | ICD-10-CM

## 2014-09-14 DIAGNOSIS — G8929 Other chronic pain: Secondary | ICD-10-CM | POA: Diagnosis present

## 2014-09-14 LAB — CBC
HCT: 33.7 % — ABNORMAL LOW (ref 36.0–46.0)
HEMOGLOBIN: 11 g/dL — AB (ref 12.0–15.0)
MCH: 21.4 pg — AB (ref 26.0–34.0)
MCHC: 32.6 g/dL (ref 30.0–36.0)
MCV: 65.7 fL — ABNORMAL LOW (ref 78.0–100.0)
PLATELETS: 179 10*3/uL (ref 150–400)
RBC: 5.13 MIL/uL — AB (ref 3.87–5.11)
RDW: 15.6 % — ABNORMAL HIGH (ref 11.5–15.5)
WBC: 9.4 10*3/uL (ref 4.0–10.5)

## 2014-09-14 LAB — RPR

## 2014-09-14 MED ORDER — LIDOCAINE HCL (PF) 1 % IJ SOLN
30.0000 mL | INTRAMUSCULAR | Status: DC | PRN
Start: 2014-09-14 — End: 2014-09-16
  Administered 2014-09-14: 30 mL via SUBCUTANEOUS
  Filled 2014-09-14: qty 30

## 2014-09-14 MED ORDER — ACETAMINOPHEN 325 MG PO TABS: 650.0000 mg | ORAL_TABLET | ORAL | Status: DC | PRN

## 2014-09-14 MED ORDER — PRENATAL MULTIVITAMIN CH
1.0000 | ORAL_TABLET | Freq: Every day | ORAL | Status: DC
Start: 2014-09-15 — End: 2014-09-16
  Administered 2014-09-15 – 2014-09-16 (×2): 1 via ORAL
  Filled 2014-09-14 (×2): qty 1

## 2014-09-14 MED ORDER — EPHEDRINE 5 MG/ML INJ
10.0000 mg | INTRAVENOUS | Status: DC | PRN
  Filled 2014-09-14: qty 2

## 2014-09-14 MED ORDER — ZOLPIDEM TARTRATE 5 MG PO TABS: 5.0000 mg | ORAL_TABLET | Freq: Every evening | ORAL | Status: DC | PRN

## 2014-09-14 MED ORDER — CITRIC ACID-SODIUM CITRATE 334-500 MG/5ML PO SOLN: 30.0000 mL | ORAL | Status: DC | PRN

## 2014-09-14 MED ORDER — ONDANSETRON HCL 4 MG/2ML IJ SOLN: 4.0000 mg | INTRAMUSCULAR | Status: DC | PRN

## 2014-09-14 MED ORDER — DEXTROSE 5 % IV SOLN
2.5000 10*6.[IU] | INTRAVENOUS | Status: DC
  Administered 2014-09-14 (×2): 2.5 10*6.[IU] via INTRAVENOUS
  Filled 2014-09-14 (×5): qty 2.5

## 2014-09-14 MED ORDER — WITCH HAZEL-GLYCERIN EX PADS: 1.0000 "application " | MEDICATED_PAD | CUTANEOUS | Status: DC | PRN

## 2014-09-14 MED ORDER — ONDANSETRON HCL 4 MG PO TABS: 4.0000 mg | ORAL_TABLET | ORAL | Status: DC | PRN

## 2014-09-14 MED ORDER — ONDANSETRON HCL 4 MG/2ML IJ SOLN: 4.0000 mg | Freq: Four times a day (QID) | INTRAMUSCULAR | Status: DC | PRN

## 2014-09-14 MED ORDER — OXYCODONE-ACETAMINOPHEN 5-325 MG PO TABS
1.0000 | ORAL_TABLET | ORAL | Status: DC | PRN
  Administered 2014-09-15: 1 via ORAL
  Filled 2014-09-14: qty 1

## 2014-09-14 MED ORDER — OXYTOCIN 40 UNITS IN LACTATED RINGERS INFUSION - SIMPLE MED
62.5000 mL/h | INTRAVENOUS | Status: DC
  Administered 2014-09-14: 62.5 mL/h via INTRAVENOUS

## 2014-09-14 MED ORDER — DIPHENHYDRAMINE HCL 50 MG/ML IJ SOLN
12.5000 mg | INTRAMUSCULAR | Status: DC | PRN
Start: 2014-09-14 — End: 2014-09-14

## 2014-09-14 MED ORDER — OXYTOCIN 40 UNITS IN LACTATED RINGERS INFUSION - SIMPLE MED
1.0000 m[IU]/min | INTRAVENOUS | Status: DC
  Administered 2014-09-14: 2 m[IU]/min via INTRAVENOUS
  Filled 2014-09-14: qty 1000

## 2014-09-14 MED ORDER — DIBUCAINE 1 % RE OINT: 1.0000 "application " | TOPICAL_OINTMENT | RECTAL | Status: DC | PRN

## 2014-09-14 MED ORDER — LACTATED RINGERS IV SOLN: 500.0000 mL | Freq: Once | INTRAVENOUS | Status: DC

## 2014-09-14 MED ORDER — PHENYLEPHRINE 40 MCG/ML (10ML) SYRINGE FOR IV PUSH (FOR BLOOD PRESSURE SUPPORT)
80.0000 ug | PREFILLED_SYRINGE | INTRAVENOUS | Status: DC | PRN
  Filled 2014-09-14: qty 2
  Filled 2014-09-14: qty 10

## 2014-09-14 MED ORDER — FENTANYL 2.5 MCG/ML BUPIVACAINE 1/10 % EPIDURAL INFUSION (WH - ANES)
14.0000 mL/h | INTRAMUSCULAR | Status: DC | PRN
  Administered 2014-09-14: 14 mL/h via EPIDURAL
  Filled 2014-09-14: qty 125

## 2014-09-14 MED ORDER — FLEET ENEMA 7-19 GM/118ML RE ENEM: 1.0000 | ENEMA | RECTAL | Status: DC | PRN

## 2014-09-14 MED ORDER — PHENYLEPHRINE 40 MCG/ML (10ML) SYRINGE FOR IV PUSH (FOR BLOOD PRESSURE SUPPORT)
80.0000 ug | PREFILLED_SYRINGE | INTRAVENOUS | Status: DC | PRN
  Filled 2014-09-14: qty 2

## 2014-09-14 MED ORDER — LANOLIN HYDROUS EX OINT: TOPICAL_OINTMENT | CUTANEOUS | Status: DC | PRN

## 2014-09-14 MED ORDER — LACTATED RINGERS IV SOLN
500.0000 mL | INTRAVENOUS | Status: DC | PRN
Start: 2014-09-14 — End: 2014-09-14

## 2014-09-14 MED ORDER — OXYCODONE-ACETAMINOPHEN 5-325 MG PO TABS: 1.0000 | ORAL_TABLET | ORAL | Status: DC | PRN

## 2014-09-14 MED ORDER — BENZOCAINE-MENTHOL 20-0.5 % EX AERO
1.0000 "application " | INHALATION_SPRAY | CUTANEOUS | Status: DC | PRN
  Administered 2014-09-14: 1 via TOPICAL
  Filled 2014-09-14: qty 56

## 2014-09-14 MED ORDER — FENTANYL 2.5 MCG/ML BUPIVACAINE 1/10 % EPIDURAL INFUSION (WH - ANES): 14.0000 mL/h | INTRAMUSCULAR | Status: DC | PRN

## 2014-09-14 MED ORDER — DEXTROSE 5 % IV SOLN
5.0000 10*6.[IU] | Freq: Once | INTRAVENOUS | Status: AC
  Administered 2014-09-14: 5 10*6.[IU] via INTRAVENOUS
  Filled 2014-09-14: qty 5

## 2014-09-14 MED ORDER — SENNOSIDES-DOCUSATE SODIUM 8.6-50 MG PO TABS
2.0000 | ORAL_TABLET | ORAL | Status: DC
  Administered 2014-09-14 – 2014-09-16 (×2): 2 via ORAL
  Filled 2014-09-14 (×2): qty 2

## 2014-09-14 MED ORDER — OXYTOCIN BOLUS FROM INFUSION: 500.0000 mL | INTRAVENOUS | Status: DC

## 2014-09-14 MED ORDER — OXYCODONE-ACETAMINOPHEN 5-325 MG PO TABS: 2.0000 | ORAL_TABLET | ORAL | Status: DC | PRN

## 2014-09-14 MED ORDER — SIMETHICONE 80 MG PO CHEW: 80.0000 mg | CHEWABLE_TABLET | ORAL | Status: DC | PRN

## 2014-09-14 MED ORDER — DIPHENHYDRAMINE HCL 25 MG PO CAPS: 25.0000 mg | ORAL_CAPSULE | Freq: Four times a day (QID) | ORAL | Status: DC | PRN

## 2014-09-14 MED ORDER — LACTATED RINGERS IV SOLN
INTRAVENOUS | Status: DC
  Administered 2014-09-14: 13:00:00 via INTRAVENOUS

## 2014-09-14 MED ORDER — LORAZEPAM 0.5 MG PO TABS
0.2500 mg | ORAL_TABLET | Freq: Once | ORAL | Status: AC
  Administered 2014-09-14: 0.25 mg via ORAL
  Filled 2014-09-14: qty 0.5

## 2014-09-14 MED ORDER — LIDOCAINE HCL (PF) 1 % IJ SOLN
INTRAMUSCULAR | Status: DC | PRN
  Administered 2014-09-14: 6 mL
  Administered 2014-09-14: 4 mL

## 2014-09-14 MED ORDER — TERBUTALINE SULFATE 1 MG/ML IJ SOLN: 0.2500 mg | Freq: Once | INTRAMUSCULAR | Status: DC | PRN

## 2014-09-14 MED ORDER — IBUPROFEN 600 MG PO TABS
600.0000 mg | ORAL_TABLET | Freq: Four times a day (QID) | ORAL | Status: DC
  Administered 2014-09-14 – 2014-09-16 (×7): 600 mg via ORAL
  Filled 2014-09-14 (×7): qty 1

## 2014-09-14 MED ORDER — TETANUS-DIPHTH-ACELL PERTUSSIS 5-2.5-18.5 LF-MCG/0.5 IM SUSP: 0.5000 mL | Freq: Once | INTRAMUSCULAR | Status: DC

## 2014-09-14 NOTE — Progress Notes (Signed)
Raven Phillips is a 38 y.o. [id number] at 5846w0d by LMP admitted for induction of labor due to Elective at term, hip pain. Feels better.   Objective: BP 185/75 mmHg  Pulse 107  Resp 18  Ht 5\' 3"  (1.6 m)  Wt 160 lb (72.576 kg)  BMI 28.35 kg/m2  SpO2 100%  LMP 12/15/2013  FHT:  FHR: 135 bpm, variability: moderate,  accelerations:  Present,  decelerations:  Absent UC:   regular, every 2-3 minutes SVE:   Dilation: 3 Effacement (%): 50 Station: -2 Exam by:: lavoie  Labs: Lab Results  Component Value Date   WBC 9.4 09/14/2014   HGB 11.0* 09/14/2014   HCT 33.7* 09/14/2014   MCV 65.7* 09/14/2014   PLT 179 09/14/2014    Assessment / Plan: [facility],  progressing well on pitocin  Labor: Progressing normally Preeclampsia:  n/a Fetal Wellbeing:  Category I Pain Control:  Epidural I/D:  GBS(+), PCN per protocol Anticipated MOD:  NSVD  Raven Phillips 09/14/2014, 2:37 PM

## 2014-09-14 NOTE — Anesthesia Preprocedure Evaluation (Signed)

## 2014-09-14 NOTE — H&P (Signed)
Raven Phillips is a 38 y.o. female presenting for labor IOL at 39 wks due to hip pain. Patient has hip dysplasia and pain which id getting worse. Uncomplicated pregnancy. Prior 2 SVDs (2nd with IOL at 39 wks). GBS(+), needs PCN. Anemia, on iron.  [facility] [place], Dr Juliene PinaMody primary..   History OB History    Gravida Para Term Preterm AB TAB SAB Ectopic Multiple Living   5 2 2  0 2 0 2 0 0 2     Past Medical History  Diagnosis Date  . Beta thalassemia trait   . Hx of varicella    Past Surgical History  Procedure Laterality Date  . Femur surgery      childhood  . Bartholin cyst marsupialization    . Dilation and evacuation  01/21/2012    Procedure: DILATATION AND EVACUATION;  Surgeon: Robley FriesVaishali R Lusine Corlett, MD;  Location: [facility];  Service: Gynecology;  Laterality: N/A;  chromosome studies  . Hip surgery  Childhood    For hip dysplasia  . Knee surgery  childhhod   Family History: family history includes Anemia in her father; Diabetes in her father; Heart disease in her mother; Hypertension in her mother; Stroke in her father; Varicose Veins in her mother. Social History:  reports that she has never smoked. She has never used smokeless tobacco. She reports that she does not drink alcohol or use illicit drugs.   Prenatal Transfer Tool  Maternal Diabetes: No Genetic Screening: Declined Maternal Ultrasounds/Referrals: Normal Fetal Ultrasounds or other Referrals:  None Maternal Substance Abuse:  No Significant Maternal Medications:  None Significant Maternal Lab Results:  Lab values include: Group B Strep positive Other Comments:  None  ROS neg   Last menstrual period 12/15/2013, unknown if currently breastfeeding. Exam Physical Exam  A&O x 3, no acute distress. Pleasant HEENT neg, no thyromegaly Lungs CTA bilat CV RRR, S1S2 normal Abdo soft, non tender, non acute Extr no edema/ tenderness Pelvic per Dr Sharol RousselLavoie's exam FHT 130s/ category I Toco regular with pitocin,   Prenatal  labs: ABO, Rh: O/Positive/-- (05/06 0000) Antibody: Negative (05/06 0000) Rubella: Immune (05/06 0000) RPR: Nonreactive (05/06 0000)  HBsAg: Negative (05/06 0000)  HIV: Non-reactive (05/06 0000)  GBS: Positive (05/29 0000)   Assessment/Plan: 38 yo G5P2022, 39 wks, GBS(+), PCN, pitocin per protocol, epidural ok. Anticipate SVD, EFW 7.1/2 lbs  Filiberto Wamble R 09/14/2014, 7:29 AM

## 2014-09-14 NOTE — Anesthesia Procedure Notes (Signed)
Epidural Patient location during procedure: OB  Preanesthetic Checklist Completed: patient identified, site marked, surgical consent, pre-op evaluation, timeout performed, IV checked, risks and benefits discussed and monitors and equipment checked  Epidural Patient position: sitting Prep: site prepped and draped and DuraPrep Patient monitoring: continuous pulse ox and blood pressure Approach: midline Injection technique: LOR air  Needle:  Needle type: Tuohy  Needle gauge: 17 G Needle length: 9 cm and 9 Needle insertion depth: 5 cm cm Catheter type: closed end flexible Catheter size: 19 Gauge Catheter at skin depth: 10 cm Test dose: negative  Assessment Events: blood not aspirated, injection not painful, no injection resistance, negative IV test and no paresthesia  Additional Notes Scoliosis Dosing of Epidural:  1st dose, through catheter ............................................Marland Kitchen.  Xylocaine 40 mg  2nd dose, through catheter, after waiting 3 minutes........Marland Kitchen.Xylocaine 60 mg    ( 1% Xylo charted as a single dose in Epic Meds for ease of charting; actual dosing was fractionated as above, for saftey's sake)  As each dose occurred, patient was free of IV sx; and patient exhibited no evidence of SA injection.  Patient is more comfortable after epidural dosed. Please see RN's note for documentation of vital signs,and FHR which are stable.  Patient reminded not to try to ambulate with numb legs, and that an RN must be present when she attempts to get up.

## 2014-09-14 NOTE — Progress Notes (Signed)
12:44 pt c/o feeling like she is having a panic attack. o2 sat 100%. Increased BP and Pulse. Dr Seymour BarsLavoie notified

## 2014-09-14 NOTE — Progress Notes (Addendum)
Subjective: Induction.  Doing well, pain mild, UCs irreg. increasing  Anesthesia none   Objective: BP 129/79 mmHg  Pulse 78  Resp 18  Ht 5\' 3"  (1.6 m)  Wt 72.576 kg (160 lb)  BMI 28.35 kg/m2  LMP 12/15/2013   FHT:  FHR: 140's bpm, variability: moderate,  accelerations:  Present,  decelerations:  Absent UC:   irregular, every 3-5 minutes VE:   Dilation: 3 Exam by:: lavoie  3/80%/Vtx/-2-3 AROM  AF clear   Assessment / Plan: Induction of labor due to GBS pos, h/o neonatal GBS septicemia, multiparous.,  progressing well on pitocin/AROM  Fetal Wellbeing:  Category I Pain Control:  Labor support without medications  Anticipated MOD:  NSVD  LAVOIE,MARIE-LYNE 09/14/2014, 11:04 AM  Correction:  H/o neonatal septicemia after 2.1/2 wks of life, needed admission, this is first time GBS(+) on RV culture and will start PCN early as well as for hip pain/ has hip dysplasia. V.Anabel Lykins, MD

## 2014-09-14 NOTE — Plan of Care (Signed)
Problem: Consults Goal: Postpartum Patient Education (See Patient Education module for education specifics.) Outcome: Completed/Met Date Met:  09/14/14  Problem: Phase I Progression Outcomes Goal: Pain controlled with appropriate interventions Outcome: Completed/Met Date Met:  09/14/14 Goal: Voiding adequately Outcome: Completed/Met Date Met:  09/14/14

## 2014-09-15 LAB — CBC
HEMATOCRIT: 26.3 % — AB (ref 36.0–46.0)
Hemoglobin: 8.6 g/dL — ABNORMAL LOW (ref 12.0–15.0)
MCH: 21.7 pg — AB (ref 26.0–34.0)
MCHC: 32.7 g/dL (ref 30.0–36.0)
MCV: 66.2 fL — ABNORMAL LOW (ref 78.0–100.0)
Platelets: 141 10*3/uL — ABNORMAL LOW (ref 150–400)
RBC: 3.97 MIL/uL (ref 3.87–5.11)
RDW: 15.7 % — AB (ref 11.5–15.5)
WBC: 11.2 10*3/uL — ABNORMAL HIGH (ref 4.0–10.5)

## 2014-09-15 LAB — TYPE AND SCREEN
ABO/RH(D): O POS
Antibody Screen: NEGATIVE

## 2014-09-15 MED ORDER — MAGNESIUM OXIDE 400 (241.3 MG) MG PO TABS
200.0000 mg | ORAL_TABLET | Freq: Every day | ORAL | Status: DC
  Administered 2014-09-15 – 2014-09-16 (×2): 200 mg via ORAL
  Filled 2014-09-15 (×2): qty 0.5

## 2014-09-15 MED ORDER — POLYSACCHARIDE IRON COMPLEX 150 MG PO CAPS
150.0000 mg | ORAL_CAPSULE | Freq: Two times a day (BID) | ORAL | Status: DC
  Administered 2014-09-15 – 2014-09-16 (×3): 150 mg via ORAL
  Filled 2014-09-15 (×3): qty 1

## 2014-09-15 NOTE — Progress Notes (Signed)
PPD #1- SVD  Subjective:   Reports feeling well Tolerating po/ No nausea or vomiting No SOB, feeling dizzy or lightheaded Bleeding is light Pain controlled with Motrin Up ad lib / ambulatory / voiding without problems Newborn: breastfeeding     Objective:   VS:  VS:  Filed Vitals:   09/14/14 1852 09/14/14 2013 09/14/14 2359 09/15/14 0518  BP: 113/65 123/63 102/62 100/56  Pulse: 78 85 67 66  Temp: 98.1 F (36.7 C) 98.1 F (36.7 C) 98.4 F (36.9 C) 98.2 F (36.8 C)  TempSrc: Oral Oral Oral Oral  Resp: 18 18 18 18   Height:      Weight:      SpO2:        LABS:  Recent Labs  09/14/14 0745 09/15/14 0550  WBC 9.4 11.2*  HGB 11.0* 8.6*  PLT 179 141*   Blood type: --/--/O POS (11/05 0745) Rubella: Immune (05/06 0000)   I&O: Intake/Output      11/05 0701 - 11/06 0700 11/06 0701 - 11/07 0700   Blood 400    Total Output 400     Net -400            Physical Exam: Alert and oriented x3 Abdomen: soft, non-tender, non-distended  Fundus: firm, non-tender, U-2 Perineum: Well approximated, no significant erythema, edema, or drainage; healing well. Lochia: small Extremities: No edema, no calf pain or tenderness    Assessment:  PPD #1 G5P2022/ S/P:induced vaginal, 1st degree laceration ABL anemia  Doing well    Plan: Continue routine post partum orders Anticipate D/C home tomorrow   Donette LarryBHAMBRI, Hyde Sires, N MSN, CNM 09/15/2014, 10:01 AM

## 2014-09-15 NOTE — Plan of Care (Signed)
Problem: Phase II Progression Outcomes Goal: Progress activity as tolerated unless otherwise ordered Outcome: Completed/Met Date Met:  09/15/14 Goal: Afebrile, VS remain stable Outcome: Completed/Met Date Met:  09/15/14 Goal: Other Phase II Outcomes/Goals Outcome: Completed/Met Date Met:  09/15/14

## 2014-09-15 NOTE — Plan of Care (Signed)
Problem: Phase I Progression Outcomes Goal: OOB as tolerated unless otherwise ordered Outcome: Completed/Met Date Met:  09/15/14 Goal: VS, stable, temp < 100.4 degrees F Outcome: Completed/Met Date Met:  09/15/14 Goal: Initial discharge plan identified Outcome: Completed/Met Date Met:  09/15/14 Goal: Other Phase I Outcomes/Goals Outcome: Completed/Met Date Met:  09/15/14  Problem: Phase II Progression Outcomes Goal: Pain controlled on oral analgesia Outcome: Completed/Met Date Met:  09/15/14 Goal: Incision intact & without signs/symptoms of infection Outcome: Not Applicable Date Met:  64/84/72 Goal: Tolerating diet Outcome: Completed/Met Date Met:  09/15/14

## 2014-09-15 NOTE — Anesthesia Postprocedure Evaluation (Signed)
  Anesthesia Post-op Note  Patient: Jacques EarthlySophie Nobis  Procedure(s) Performed: * No procedures listed *  Patient Location: PACU and Mother/Baby  Anesthesia Type:Epidural  Level of Consciousness: awake, alert  and oriented  Airway and Oxygen Therapy: Patient Spontanous Breathing  Post-op Pain: mild  Post-op Assessment: Patient's Cardiovascular Status Stable, Respiratory Function Stable, No signs of Nausea or vomiting, Adequate PO intake, Pain level controlled, No headache, No backache, No residual numbness and No residual motor weakness  Post-op Vital Signs: Reviewed and stable  Last Vitals:  Filed Vitals:   09/15/14 0518  BP: 100/56  Pulse: 66  Temp: 36.8 C  Resp: 18    Complications: No apparent anesthesia complications

## 2014-09-16 ENCOUNTER — Encounter (HOSPITAL_COMMUNITY): Payer: Self-pay

## 2014-09-16 MED ORDER — IBUPROFEN 600 MG PO TABS: 600.0000 mg | ORAL_TABLET | Freq: Four times a day (QID) | ORAL | Status: DC

## 2014-09-16 MED ORDER — OXYCODONE-ACETAMINOPHEN 5-325 MG PO TABS: 1.0000 | ORAL_TABLET | ORAL | Status: DC | PRN

## 2014-09-16 MED ORDER — MAGNESIUM OXIDE 400 (241.3 MG) MG PO TABS: 200.0000 mg | ORAL_TABLET | Freq: Every day | ORAL | Status: DC

## 2014-09-16 MED ORDER — POLYSACCHARIDE IRON COMPLEX 150 MG PO CAPS: 150.0000 mg | ORAL_CAPSULE | Freq: Two times a day (BID) | ORAL | Status: DC

## 2014-09-16 NOTE — Discharge Summary (Signed)
Obstetric Discharge Summary Reason for Admission: IOL for worsening hip pain Prenatal Course: hx hip dysplasia, GBS+, anemia, AMA Intrapartum Procedures: spontaneous vaginal delivery and GBS prophylaxis Postpartum Procedures: none Complications-Operative and Postpartum: 1st degree perineal laceration HEMOGLOBIN  Date Value Ref Range Status  09/15/2014 8.6* 12.0 - 15.0 g/dL Final    Comment:    REPEATED TO VERIFY DELTA CHECK NOTED    HCT  Date Value Ref Range Status  09/15/2014 26.3* 36.0 - 46.0 % Final    Physical Exam:  General: alert and cooperative Lochia: appropriate Uterine Fundus: firm Incision: healing well, no significant drainage, no dehiscence, no significant erythema DVT Evaluation: No evidence of DVT seen on physical exam. Negative Homan's sign. No cords or calf tenderness. No significant calf/ankle edema.  Discharge Diagnoses: Term Pregnancy-delivered and ABL anemia  Discharge Information: Date: 09/16/2014 Activity: pelvic rest Diet: routine Medications: PNV, Ibuprofen, Iron, Percocet and Mag oxide Condition: stable Instructions: refer to practice specific booklet Discharge to: home Follow-up Information    Follow up with MODY,VAISHALI R, MD. Schedule an appointment as soon as possible for a visit in 6 weeks.   Specialty:  Obstetrics and Gynecology   Contact information:   [address] Gash[address] [place] [id number] [phone]       Newborn Data: Live born female on 09/14/14 Birth Weight: 6 lb 15 oz (3147 g) APGAR: 9, 9  Home with mother.  Gyan Cambre, N 09/16/2014, 7:48 PM

## 2014-09-16 NOTE — Plan of Care (Signed)
Problem: Discharge Progression Outcomes Goal: Barriers To Progression Addressed/Resolved Outcome: Completed/Met Date Met:  09/16/14 Goal: Tolerating diet Outcome: Completed/Met Date Met:  23/95/32 Goal: Complications resolved/controlled Outcome: Completed/Met Date Met:  09/16/14 Goal: Pain controlled with appropriate interventions Outcome: Not Applicable Date Met:  02/33/43

## 2014-09-20 ENCOUNTER — Inpatient Hospital Stay (HOSPITAL_COMMUNITY)
Admission: AD | Admit: 2014-09-20 | Payer: BC Managed Care – PPO | Source: Ambulatory Visit | Admitting: Obstetrics & Gynecology

## 2014-09-27 ENCOUNTER — Encounter (HOSPITAL_COMMUNITY): Payer: Self-pay

## 2016-04-23 DIAGNOSIS — F410 Panic disorder [episodic paroxysmal anxiety] without agoraphobia: Secondary | ICD-10-CM | POA: Diagnosis not present

## 2016-05-08 ENCOUNTER — Encounter: Payer: Self-pay | Admitting: Podiatry

## 2016-05-08 ENCOUNTER — Ambulatory Visit (INDEPENDENT_AMBULATORY_CARE_PROVIDER_SITE_OTHER): Payer: BLUE CROSS/BLUE SHIELD | Admitting: Podiatry

## 2016-05-08 VITALS — BP 130/80 | HR 73 | Resp 12

## 2016-05-08 DIAGNOSIS — M7752 Other enthesopathy of left foot: Secondary | ICD-10-CM

## 2016-05-08 DIAGNOSIS — M2010 Hallux valgus (acquired), unspecified foot: Secondary | ICD-10-CM

## 2016-05-08 DIAGNOSIS — Q828 Other specified congenital malformations of skin: Secondary | ICD-10-CM

## 2016-05-08 DIAGNOSIS — M779 Enthesopathy, unspecified: Principal | ICD-10-CM

## 2016-05-08 DIAGNOSIS — M778 Other enthesopathies, not elsewhere classified: Secondary | ICD-10-CM

## 2016-05-08 NOTE — Progress Notes (Addendum)
   Subjective:    Patient ID: Raven EarthlySophie Felker, female    DOB: 04/23/1976, 40 y.o.   MRN: [id number]  HPIThis patient presents to the office with painful callus on the ball of her left foot.  She says she has had this lesion for years and always treated it with acid preparations.  She decided she needs to have this lesion professionally evaluated and treated.   She says the skin lesion becomes thick and callus and is painful walking especially barefoot.  No professional help has been sought.  She presents for evaluation and treatment.  Patient admits that she has had hip pathology as a child which has led to her having short left leg.     Review of Systems  All other systems reviewed and are negative.      Objective:   Physical Exam GENERAL APPEARANCE: Alert, conversant. Appropriately groomed. No acute distress.  VASCULAR: Pedal pulses are  palpable at  [facility] bilateral.  Capillary refill time is immediate to all digits,  Normal temperature gradient.  Digital hair growth is present bilateral  NEUROLOGIC: sensation is normal to 5.07 monofilament at 5/5 sites bilateral.  Light touch is intact bilateral, Muscle strength normal.  MUSCULOSKELETAL: acceptable muscle strength, tone and stability bilateral.  Intrinsic muscluature intact bilateral.  Rectus appearance of foot and digits noted bilateral. HAV deformity 1st MPJ  B/L.  Porokeratosis/callus sub 2 B/L with lesion thicker left foot. Limb length discrepancy left leg. Pain noted sub 2 left foot.  DERMATOLOGIC: skin color, texture, and turgor are within normal limits.  No preulcerative lesions or ulcers  are seen, no interdigital maceration noted.  No open lesions present.  Digital nails are asymptomatic. No drainage noted.Skin lesion is callused with white appearing tissue which resembles scar tissue.  No cauliflower appearing tissue with thrombi noted.        Assessment & Plan:  Capsulitis sub 2 right  HAV 1st MPJ  B/L  Porokeratosis sub 2  left foot.   IE  Debridement of skin lesion left foot.  Dispersion padding applied left forefoot. Discussed condition with patient.  Told her to discontinue acid usage.  To schedule her an appointment with [facility] for orthotic casting.  The left orthotic should have cut-out sub second metatarsal as well as heel lift left foot.  Possible surgery if the orthoses are ineffective.   Helane GuntherGregory Mayer DPM

## 2016-05-09 ENCOUNTER — Ambulatory Visit (INDEPENDENT_AMBULATORY_CARE_PROVIDER_SITE_OTHER): Payer: BLUE CROSS/BLUE SHIELD | Admitting: *Deleted

## 2016-05-09 DIAGNOSIS — M2010 Hallux valgus (acquired), unspecified foot: Secondary | ICD-10-CM

## 2016-05-09 DIAGNOSIS — M778 Other enthesopathies, not elsewhere classified: Secondary | ICD-10-CM

## 2016-05-09 DIAGNOSIS — M779 Enthesopathy, unspecified: Principal | ICD-10-CM

## 2016-05-09 DIAGNOSIS — M7752 Other enthesopathy of left foot: Secondary | ICD-10-CM | POA: Diagnosis not present

## 2016-05-09 NOTE — Progress Notes (Signed)
Patient ID: Raven Phillips, female   DOB: [birth year], 40 y.o.   MRN: [id number] Patient presents at Dr Aram BeechamMayer's request to be scanned for custom molded orthotics.  Patient will follow up in 3 weeks for fitting.

## 2016-05-30 ENCOUNTER — Ambulatory Visit: Payer: BLUE CROSS/BLUE SHIELD | Admitting: *Deleted

## 2016-05-30 DIAGNOSIS — M778 Other enthesopathies, not elsewhere classified: Secondary | ICD-10-CM

## 2016-05-30 DIAGNOSIS — M779 Enthesopathy, unspecified: Principal | ICD-10-CM

## 2016-05-30 NOTE — Progress Notes (Signed)
Patient ID: Raven EarthlySophie Stierwalt, female   DOB: 11/13/1975, 40 y.o.   MRN: [id number] Patient presents for orthotic pick up.  Verbal and written break in and wear instructions given.  Patient will follow up in 4 weeks if symptoms worsen or fail to improve.

## 2016-05-30 NOTE — Patient Instructions (Signed)

## 2016-10-23 DIAGNOSIS — Z113 Encounter for screening for infections with a predominantly sexual mode of transmission: Secondary | ICD-10-CM | POA: Diagnosis not present

## 2016-10-23 DIAGNOSIS — Z01419 Encounter for gynecological examination (general) (routine) without abnormal findings: Secondary | ICD-10-CM | POA: Diagnosis not present

## 2016-10-23 DIAGNOSIS — Z1151 Encounter for screening for human papillomavirus (HPV): Secondary | ICD-10-CM | POA: Diagnosis not present

## 2016-11-12 DIAGNOSIS — Z13220 Encounter for screening for lipoid disorders: Secondary | ICD-10-CM | POA: Diagnosis not present

## 2016-11-12 DIAGNOSIS — Z0000 Encounter for general adult medical examination without abnormal findings: Secondary | ICD-10-CM | POA: Diagnosis not present

## 2016-11-12 DIAGNOSIS — Z130 Encounter for screening for diseases of the blood and blood-forming organs and certain disorders involving the immune mechanism: Secondary | ICD-10-CM | POA: Diagnosis not present

## 2016-11-12 DIAGNOSIS — Z1329 Encounter for screening for other suspected endocrine disorder: Secondary | ICD-10-CM | POA: Diagnosis not present

## 2016-11-19 DIAGNOSIS — F410 Panic disorder [episodic paroxysmal anxiety] without agoraphobia: Secondary | ICD-10-CM | POA: Diagnosis not present

## 2016-12-13 DIAGNOSIS — R110 Nausea: Secondary | ICD-10-CM | POA: Diagnosis not present

## 2016-12-13 DIAGNOSIS — J019 Acute sinusitis, unspecified: Secondary | ICD-10-CM | POA: Diagnosis not present

## 2016-12-13 DIAGNOSIS — K2970 Gastritis, unspecified, without bleeding: Secondary | ICD-10-CM | POA: Diagnosis not present

## 2017-03-04 DIAGNOSIS — H6983 Other specified disorders of Eustachian tube, bilateral: Secondary | ICD-10-CM | POA: Diagnosis not present

## 2017-05-19 DIAGNOSIS — H9201 Otalgia, right ear: Secondary | ICD-10-CM | POA: Diagnosis not present

## 2017-11-10 HISTORY — PX: OTHER SURGICAL HISTORY: SHX169

## 2018-03-17 ENCOUNTER — Telehealth: Payer: Self-pay | Admitting: Podiatry

## 2018-03-17 DIAGNOSIS — M7750 Other enthesopathy of unspecified foot: Secondary | ICD-10-CM | POA: Diagnosis not present

## 2018-03-17 NOTE — Telephone Encounter (Signed)
Pt called asking if she could get another pair of orthotics made and billed to her insurance.the patient would like a pair made for an running shoe or athletic type.She was last seen 2017 by Dr Stacie Acres. Ok to order them per Dr Stacie Acres.

## 2018-03-22 NOTE — Telephone Encounter (Signed)
Raven Phillips ordered orthotics and charges have been entered. I have called pt and left a message that orthotics have been ordered and I would call pt when they come in.

## 2018-03-31 NOTE — Telephone Encounter (Signed)
lvm for pt that orthotics are and ok to pick them up.Marland KitchenMarland Kitchen

## 2018-04-07 NOTE — Telephone Encounter (Signed)
Pt left voicemail asking if we could ship the orthotics to her because she is having a problem getting to the office due to her children.   I called pt back and left message that I  will put them in the mail tomorrow and to call if any questions.

## 2018-04-08 NOTE — Telephone Encounter (Signed)
Mailed orthotics

## 2018-05-06 DIAGNOSIS — M1632 Unilateral osteoarthritis resulting from hip dysplasia, left hip: Secondary | ICD-10-CM | POA: Insufficient documentation

## 2018-05-06 DIAGNOSIS — M2170 Unequal limb length (acquired), unspecified site: Secondary | ICD-10-CM | POA: Diagnosis not present

## 2018-05-08 DIAGNOSIS — H6981 Other specified disorders of Eustachian tube, right ear: Secondary | ICD-10-CM | POA: Diagnosis not present

## 2018-07-02 DIAGNOSIS — M1632 Unilateral osteoarthritis resulting from hip dysplasia, left hip: Secondary | ICD-10-CM | POA: Diagnosis not present

## 2018-07-14 DIAGNOSIS — M1612 Unilateral primary osteoarthritis, left hip: Secondary | ICD-10-CM | POA: Diagnosis not present

## 2018-07-14 DIAGNOSIS — Z01818 Encounter for other preprocedural examination: Secondary | ICD-10-CM | POA: Diagnosis not present

## 2018-07-20 DIAGNOSIS — M1612 Unilateral primary osteoarthritis, left hip: Secondary | ICD-10-CM | POA: Diagnosis not present

## 2018-09-02 DIAGNOSIS — M25552 Pain in left hip: Secondary | ICD-10-CM | POA: Diagnosis not present

## 2018-09-13 DIAGNOSIS — M25552 Pain in left hip: Secondary | ICD-10-CM | POA: Diagnosis not present

## 2018-09-17 DIAGNOSIS — M25552 Pain in left hip: Secondary | ICD-10-CM | POA: Diagnosis not present

## 2018-09-22 DIAGNOSIS — M25552 Pain in left hip: Secondary | ICD-10-CM | POA: Diagnosis not present

## 2018-09-27 DIAGNOSIS — M25552 Pain in left hip: Secondary | ICD-10-CM | POA: Diagnosis not present

## 2018-09-29 DIAGNOSIS — D563 Thalassemia minor: Secondary | ICD-10-CM | POA: Diagnosis not present

## 2018-09-29 DIAGNOSIS — F410 Panic disorder [episodic paroxysmal anxiety] without agoraphobia: Secondary | ICD-10-CM | POA: Diagnosis not present

## 2018-09-29 DIAGNOSIS — R5383 Other fatigue: Secondary | ICD-10-CM | POA: Diagnosis not present

## 2018-10-06 DIAGNOSIS — F411 Generalized anxiety disorder: Secondary | ICD-10-CM | POA: Diagnosis not present

## 2018-10-06 DIAGNOSIS — F330 Major depressive disorder, recurrent, mild: Secondary | ICD-10-CM | POA: Diagnosis not present

## 2018-10-06 DIAGNOSIS — F410 Panic disorder [episodic paroxysmal anxiety] without agoraphobia: Secondary | ICD-10-CM | POA: Diagnosis not present

## 2018-10-20 DIAGNOSIS — F411 Generalized anxiety disorder: Secondary | ICD-10-CM | POA: Diagnosis not present

## 2018-10-20 DIAGNOSIS — F410 Panic disorder [episodic paroxysmal anxiety] without agoraphobia: Secondary | ICD-10-CM | POA: Diagnosis not present

## 2018-10-20 DIAGNOSIS — F330 Major depressive disorder, recurrent, mild: Secondary | ICD-10-CM | POA: Diagnosis not present

## 2018-11-17 DIAGNOSIS — F410 Panic disorder [episodic paroxysmal anxiety] without agoraphobia: Secondary | ICD-10-CM | POA: Diagnosis not present

## 2018-11-17 DIAGNOSIS — F411 Generalized anxiety disorder: Secondary | ICD-10-CM | POA: Diagnosis not present

## 2018-11-17 DIAGNOSIS — F330 Major depressive disorder, recurrent, mild: Secondary | ICD-10-CM | POA: Diagnosis not present

## 2018-11-19 DIAGNOSIS — M545 Low back pain: Secondary | ICD-10-CM | POA: Diagnosis not present

## 2018-11-22 DIAGNOSIS — M545 Low back pain: Secondary | ICD-10-CM | POA: Diagnosis not present

## 2018-11-24 DIAGNOSIS — M545 Low back pain: Secondary | ICD-10-CM | POA: Diagnosis not present

## 2018-12-01 DIAGNOSIS — M545 Low back pain: Secondary | ICD-10-CM | POA: Diagnosis not present

## 2018-12-08 DIAGNOSIS — M545 Low back pain: Secondary | ICD-10-CM | POA: Diagnosis not present

## 2018-12-08 DIAGNOSIS — D509 Iron deficiency anemia, unspecified: Secondary | ICD-10-CM | POA: Diagnosis not present

## 2018-12-16 DIAGNOSIS — M51369 Other intervertebral disc degeneration, lumbar region without mention of lumbar back pain or lower extremity pain: Secondary | ICD-10-CM | POA: Insufficient documentation

## 2018-12-16 DIAGNOSIS — M5136 Other intervertebral disc degeneration, lumbar region: Secondary | ICD-10-CM | POA: Insufficient documentation

## 2018-12-22 DIAGNOSIS — M545 Low back pain: Secondary | ICD-10-CM | POA: Diagnosis not present

## 2018-12-29 DIAGNOSIS — M545 Low back pain: Secondary | ICD-10-CM | POA: Diagnosis not present

## 2018-12-31 DIAGNOSIS — M1632 Unilateral osteoarthritis resulting from hip dysplasia, left hip: Secondary | ICD-10-CM | POA: Diagnosis not present

## 2019-01-05 DIAGNOSIS — M545 Low back pain: Secondary | ICD-10-CM | POA: Diagnosis not present

## 2019-01-10 DIAGNOSIS — M545 Low back pain: Secondary | ICD-10-CM | POA: Diagnosis not present

## 2019-01-12 DIAGNOSIS — M545 Low back pain: Secondary | ICD-10-CM | POA: Diagnosis not present

## 2019-01-17 DIAGNOSIS — M545 Low back pain: Secondary | ICD-10-CM | POA: Diagnosis not present

## 2019-01-21 DIAGNOSIS — M545 Low back pain: Secondary | ICD-10-CM | POA: Diagnosis not present

## 2019-03-09 DIAGNOSIS — R2689 Other abnormalities of gait and mobility: Secondary | ICD-10-CM | POA: Diagnosis not present

## 2019-03-09 DIAGNOSIS — M6281 Muscle weakness (generalized): Secondary | ICD-10-CM | POA: Diagnosis not present

## 2019-03-09 DIAGNOSIS — Z4789 Encounter for other orthopedic aftercare: Secondary | ICD-10-CM | POA: Diagnosis not present

## 2019-03-09 DIAGNOSIS — M545 Low back pain: Secondary | ICD-10-CM | POA: Diagnosis not present

## 2019-03-10 DIAGNOSIS — M6281 Muscle weakness (generalized): Secondary | ICD-10-CM | POA: Diagnosis not present

## 2019-03-10 DIAGNOSIS — M545 Low back pain: Secondary | ICD-10-CM | POA: Diagnosis not present

## 2019-03-10 DIAGNOSIS — R2689 Other abnormalities of gait and mobility: Secondary | ICD-10-CM | POA: Diagnosis not present

## 2019-03-10 DIAGNOSIS — Z4789 Encounter for other orthopedic aftercare: Secondary | ICD-10-CM | POA: Diagnosis not present

## 2019-03-11 DIAGNOSIS — R2689 Other abnormalities of gait and mobility: Secondary | ICD-10-CM | POA: Diagnosis not present

## 2019-03-11 DIAGNOSIS — Z4789 Encounter for other orthopedic aftercare: Secondary | ICD-10-CM | POA: Diagnosis not present

## 2019-03-11 DIAGNOSIS — M6281 Muscle weakness (generalized): Secondary | ICD-10-CM | POA: Diagnosis not present

## 2019-03-11 DIAGNOSIS — M545 Low back pain: Secondary | ICD-10-CM | POA: Diagnosis not present

## 2019-03-16 DIAGNOSIS — Z4789 Encounter for other orthopedic aftercare: Secondary | ICD-10-CM | POA: Diagnosis not present

## 2019-03-16 DIAGNOSIS — D563 Thalassemia minor: Secondary | ICD-10-CM | POA: Diagnosis not present

## 2019-03-16 DIAGNOSIS — F5101 Primary insomnia: Secondary | ICD-10-CM | POA: Diagnosis not present

## 2019-03-16 DIAGNOSIS — M6281 Muscle weakness (generalized): Secondary | ICD-10-CM | POA: Diagnosis not present

## 2019-03-16 DIAGNOSIS — M545 Low back pain: Secondary | ICD-10-CM | POA: Diagnosis not present

## 2019-03-16 DIAGNOSIS — R2689 Other abnormalities of gait and mobility: Secondary | ICD-10-CM | POA: Diagnosis not present

## 2019-03-16 DIAGNOSIS — Z79899 Other long term (current) drug therapy: Secondary | ICD-10-CM | POA: Diagnosis not present

## 2019-03-16 DIAGNOSIS — F410 Panic disorder [episodic paroxysmal anxiety] without agoraphobia: Secondary | ICD-10-CM | POA: Diagnosis not present

## 2019-03-17 DIAGNOSIS — Z4789 Encounter for other orthopedic aftercare: Secondary | ICD-10-CM | POA: Diagnosis not present

## 2019-03-17 DIAGNOSIS — R2689 Other abnormalities of gait and mobility: Secondary | ICD-10-CM | POA: Diagnosis not present

## 2019-03-17 DIAGNOSIS — M6281 Muscle weakness (generalized): Secondary | ICD-10-CM | POA: Diagnosis not present

## 2019-03-17 DIAGNOSIS — M545 Low back pain: Secondary | ICD-10-CM | POA: Diagnosis not present

## 2019-03-18 DIAGNOSIS — M6281 Muscle weakness (generalized): Secondary | ICD-10-CM | POA: Diagnosis not present

## 2019-03-18 DIAGNOSIS — R2689 Other abnormalities of gait and mobility: Secondary | ICD-10-CM | POA: Diagnosis not present

## 2019-03-18 DIAGNOSIS — Z4789 Encounter for other orthopedic aftercare: Secondary | ICD-10-CM | POA: Diagnosis not present

## 2019-03-18 DIAGNOSIS — M545 Low back pain: Secondary | ICD-10-CM | POA: Diagnosis not present

## 2019-03-23 DIAGNOSIS — M545 Low back pain: Secondary | ICD-10-CM | POA: Diagnosis not present

## 2019-03-23 DIAGNOSIS — Z4789 Encounter for other orthopedic aftercare: Secondary | ICD-10-CM | POA: Diagnosis not present

## 2019-03-23 DIAGNOSIS — M6281 Muscle weakness (generalized): Secondary | ICD-10-CM | POA: Diagnosis not present

## 2019-03-23 DIAGNOSIS — R2689 Other abnormalities of gait and mobility: Secondary | ICD-10-CM | POA: Diagnosis not present

## 2019-03-24 DIAGNOSIS — M545 Low back pain: Secondary | ICD-10-CM | POA: Diagnosis not present

## 2019-03-24 DIAGNOSIS — M6281 Muscle weakness (generalized): Secondary | ICD-10-CM | POA: Diagnosis not present

## 2019-03-24 DIAGNOSIS — Z4789 Encounter for other orthopedic aftercare: Secondary | ICD-10-CM | POA: Diagnosis not present

## 2019-03-24 DIAGNOSIS — R2689 Other abnormalities of gait and mobility: Secondary | ICD-10-CM | POA: Diagnosis not present

## 2019-03-25 DIAGNOSIS — M545 Low back pain: Secondary | ICD-10-CM | POA: Diagnosis not present

## 2019-03-25 DIAGNOSIS — R2689 Other abnormalities of gait and mobility: Secondary | ICD-10-CM | POA: Diagnosis not present

## 2019-03-25 DIAGNOSIS — M6281 Muscle weakness (generalized): Secondary | ICD-10-CM | POA: Diagnosis not present

## 2019-03-25 DIAGNOSIS — Z4789 Encounter for other orthopedic aftercare: Secondary | ICD-10-CM | POA: Diagnosis not present

## 2019-03-31 DIAGNOSIS — M6281 Muscle weakness (generalized): Secondary | ICD-10-CM | POA: Diagnosis not present

## 2019-03-31 DIAGNOSIS — M545 Low back pain: Secondary | ICD-10-CM | POA: Diagnosis not present

## 2019-03-31 DIAGNOSIS — Z4789 Encounter for other orthopedic aftercare: Secondary | ICD-10-CM | POA: Diagnosis not present

## 2019-03-31 DIAGNOSIS — R2689 Other abnormalities of gait and mobility: Secondary | ICD-10-CM | POA: Diagnosis not present

## 2019-04-01 DIAGNOSIS — R2689 Other abnormalities of gait and mobility: Secondary | ICD-10-CM | POA: Diagnosis not present

## 2019-04-01 DIAGNOSIS — M545 Low back pain: Secondary | ICD-10-CM | POA: Diagnosis not present

## 2019-04-01 DIAGNOSIS — Z4789 Encounter for other orthopedic aftercare: Secondary | ICD-10-CM | POA: Diagnosis not present

## 2019-04-01 DIAGNOSIS — M6281 Muscle weakness (generalized): Secondary | ICD-10-CM | POA: Diagnosis not present

## 2019-04-06 DIAGNOSIS — M6281 Muscle weakness (generalized): Secondary | ICD-10-CM | POA: Diagnosis not present

## 2019-04-06 DIAGNOSIS — Z4789 Encounter for other orthopedic aftercare: Secondary | ICD-10-CM | POA: Diagnosis not present

## 2019-04-06 DIAGNOSIS — M545 Low back pain: Secondary | ICD-10-CM | POA: Diagnosis not present

## 2019-04-06 DIAGNOSIS — R2689 Other abnormalities of gait and mobility: Secondary | ICD-10-CM | POA: Diagnosis not present

## 2019-04-07 DIAGNOSIS — M6281 Muscle weakness (generalized): Secondary | ICD-10-CM | POA: Diagnosis not present

## 2019-04-07 DIAGNOSIS — M545 Low back pain: Secondary | ICD-10-CM | POA: Diagnosis not present

## 2019-04-07 DIAGNOSIS — R2689 Other abnormalities of gait and mobility: Secondary | ICD-10-CM | POA: Diagnosis not present

## 2019-04-07 DIAGNOSIS — Z4789 Encounter for other orthopedic aftercare: Secondary | ICD-10-CM | POA: Diagnosis not present

## 2019-04-08 DIAGNOSIS — Z4789 Encounter for other orthopedic aftercare: Secondary | ICD-10-CM | POA: Diagnosis not present

## 2019-04-08 DIAGNOSIS — M545 Low back pain: Secondary | ICD-10-CM | POA: Diagnosis not present

## 2019-04-08 DIAGNOSIS — R2689 Other abnormalities of gait and mobility: Secondary | ICD-10-CM | POA: Diagnosis not present

## 2019-04-08 DIAGNOSIS — M6281 Muscle weakness (generalized): Secondary | ICD-10-CM | POA: Diagnosis not present

## 2019-04-14 DIAGNOSIS — M6281 Muscle weakness (generalized): Secondary | ICD-10-CM | POA: Diagnosis not present

## 2019-04-14 DIAGNOSIS — Z4789 Encounter for other orthopedic aftercare: Secondary | ICD-10-CM | POA: Diagnosis not present

## 2019-04-14 DIAGNOSIS — R2689 Other abnormalities of gait and mobility: Secondary | ICD-10-CM | POA: Diagnosis not present

## 2019-04-14 DIAGNOSIS — M545 Low back pain: Secondary | ICD-10-CM | POA: Diagnosis not present

## 2019-04-15 DIAGNOSIS — Z4789 Encounter for other orthopedic aftercare: Secondary | ICD-10-CM | POA: Diagnosis not present

## 2019-04-15 DIAGNOSIS — M6281 Muscle weakness (generalized): Secondary | ICD-10-CM | POA: Diagnosis not present

## 2019-04-15 DIAGNOSIS — R2689 Other abnormalities of gait and mobility: Secondary | ICD-10-CM | POA: Diagnosis not present

## 2019-04-15 DIAGNOSIS — M545 Low back pain: Secondary | ICD-10-CM | POA: Diagnosis not present

## 2019-04-20 DIAGNOSIS — Z4789 Encounter for other orthopedic aftercare: Secondary | ICD-10-CM | POA: Diagnosis not present

## 2019-04-20 DIAGNOSIS — M6281 Muscle weakness (generalized): Secondary | ICD-10-CM | POA: Diagnosis not present

## 2019-04-20 DIAGNOSIS — M545 Low back pain: Secondary | ICD-10-CM | POA: Diagnosis not present

## 2019-04-20 DIAGNOSIS — R2689 Other abnormalities of gait and mobility: Secondary | ICD-10-CM | POA: Diagnosis not present

## 2019-04-21 DIAGNOSIS — M545 Low back pain: Secondary | ICD-10-CM | POA: Diagnosis not present

## 2019-04-21 DIAGNOSIS — M6281 Muscle weakness (generalized): Secondary | ICD-10-CM | POA: Diagnosis not present

## 2019-04-21 DIAGNOSIS — Z4789 Encounter for other orthopedic aftercare: Secondary | ICD-10-CM | POA: Diagnosis not present

## 2019-04-21 DIAGNOSIS — R2689 Other abnormalities of gait and mobility: Secondary | ICD-10-CM | POA: Diagnosis not present

## 2019-04-27 DIAGNOSIS — M545 Low back pain: Secondary | ICD-10-CM | POA: Diagnosis not present

## 2019-04-27 DIAGNOSIS — Z4789 Encounter for other orthopedic aftercare: Secondary | ICD-10-CM | POA: Diagnosis not present

## 2019-04-27 DIAGNOSIS — M6281 Muscle weakness (generalized): Secondary | ICD-10-CM | POA: Diagnosis not present

## 2019-04-27 DIAGNOSIS — R2689 Other abnormalities of gait and mobility: Secondary | ICD-10-CM | POA: Diagnosis not present

## 2019-04-28 DIAGNOSIS — R2689 Other abnormalities of gait and mobility: Secondary | ICD-10-CM | POA: Diagnosis not present

## 2019-04-28 DIAGNOSIS — M6281 Muscle weakness (generalized): Secondary | ICD-10-CM | POA: Diagnosis not present

## 2019-04-28 DIAGNOSIS — M545 Low back pain: Secondary | ICD-10-CM | POA: Diagnosis not present

## 2019-04-28 DIAGNOSIS — Z4789 Encounter for other orthopedic aftercare: Secondary | ICD-10-CM | POA: Diagnosis not present

## 2019-04-29 DIAGNOSIS — R2689 Other abnormalities of gait and mobility: Secondary | ICD-10-CM | POA: Diagnosis not present

## 2019-04-29 DIAGNOSIS — M6281 Muscle weakness (generalized): Secondary | ICD-10-CM | POA: Diagnosis not present

## 2019-04-29 DIAGNOSIS — Z4789 Encounter for other orthopedic aftercare: Secondary | ICD-10-CM | POA: Diagnosis not present

## 2019-04-29 DIAGNOSIS — M545 Low back pain: Secondary | ICD-10-CM | POA: Diagnosis not present

## 2019-05-04 DIAGNOSIS — R2689 Other abnormalities of gait and mobility: Secondary | ICD-10-CM | POA: Diagnosis not present

## 2019-05-04 DIAGNOSIS — M545 Low back pain: Secondary | ICD-10-CM | POA: Diagnosis not present

## 2019-05-04 DIAGNOSIS — M6281 Muscle weakness (generalized): Secondary | ICD-10-CM | POA: Diagnosis not present

## 2019-05-04 DIAGNOSIS — Z4789 Encounter for other orthopedic aftercare: Secondary | ICD-10-CM | POA: Diagnosis not present

## 2019-05-05 DIAGNOSIS — M6281 Muscle weakness (generalized): Secondary | ICD-10-CM | POA: Diagnosis not present

## 2019-05-05 DIAGNOSIS — R2689 Other abnormalities of gait and mobility: Secondary | ICD-10-CM | POA: Diagnosis not present

## 2019-05-05 DIAGNOSIS — Z4789 Encounter for other orthopedic aftercare: Secondary | ICD-10-CM | POA: Diagnosis not present

## 2019-05-05 DIAGNOSIS — M545 Low back pain: Secondary | ICD-10-CM | POA: Diagnosis not present

## 2019-05-11 DIAGNOSIS — Z4789 Encounter for other orthopedic aftercare: Secondary | ICD-10-CM | POA: Diagnosis not present

## 2019-05-11 DIAGNOSIS — M545 Low back pain: Secondary | ICD-10-CM | POA: Diagnosis not present

## 2019-05-11 DIAGNOSIS — R2689 Other abnormalities of gait and mobility: Secondary | ICD-10-CM | POA: Diagnosis not present

## 2019-05-11 DIAGNOSIS — M6281 Muscle weakness (generalized): Secondary | ICD-10-CM | POA: Diagnosis not present

## 2019-05-12 DIAGNOSIS — M545 Low back pain: Secondary | ICD-10-CM | POA: Diagnosis not present

## 2019-05-12 DIAGNOSIS — Z4789 Encounter for other orthopedic aftercare: Secondary | ICD-10-CM | POA: Diagnosis not present

## 2019-05-12 DIAGNOSIS — M6281 Muscle weakness (generalized): Secondary | ICD-10-CM | POA: Diagnosis not present

## 2019-05-12 DIAGNOSIS — R2689 Other abnormalities of gait and mobility: Secondary | ICD-10-CM | POA: Diagnosis not present

## 2019-05-18 DIAGNOSIS — M6281 Muscle weakness (generalized): Secondary | ICD-10-CM | POA: Diagnosis not present

## 2019-05-18 DIAGNOSIS — M545 Low back pain: Secondary | ICD-10-CM | POA: Diagnosis not present

## 2019-05-18 DIAGNOSIS — Z4789 Encounter for other orthopedic aftercare: Secondary | ICD-10-CM | POA: Diagnosis not present

## 2019-05-18 DIAGNOSIS — R2689 Other abnormalities of gait and mobility: Secondary | ICD-10-CM | POA: Diagnosis not present

## 2019-05-19 DIAGNOSIS — Z4789 Encounter for other orthopedic aftercare: Secondary | ICD-10-CM | POA: Diagnosis not present

## 2019-05-19 DIAGNOSIS — M6281 Muscle weakness (generalized): Secondary | ICD-10-CM | POA: Diagnosis not present

## 2019-05-19 DIAGNOSIS — R2689 Other abnormalities of gait and mobility: Secondary | ICD-10-CM | POA: Diagnosis not present

## 2019-05-19 DIAGNOSIS — M545 Low back pain: Secondary | ICD-10-CM | POA: Diagnosis not present

## 2019-06-15 DIAGNOSIS — R2689 Other abnormalities of gait and mobility: Secondary | ICD-10-CM | POA: Diagnosis not present

## 2019-06-15 DIAGNOSIS — Z4789 Encounter for other orthopedic aftercare: Secondary | ICD-10-CM | POA: Diagnosis not present

## 2019-06-15 DIAGNOSIS — M6281 Muscle weakness (generalized): Secondary | ICD-10-CM | POA: Diagnosis not present

## 2019-06-15 DIAGNOSIS — M545 Low back pain: Secondary | ICD-10-CM | POA: Diagnosis not present

## 2019-06-16 DIAGNOSIS — M6281 Muscle weakness (generalized): Secondary | ICD-10-CM | POA: Diagnosis not present

## 2019-06-16 DIAGNOSIS — Z4789 Encounter for other orthopedic aftercare: Secondary | ICD-10-CM | POA: Diagnosis not present

## 2019-06-16 DIAGNOSIS — R2689 Other abnormalities of gait and mobility: Secondary | ICD-10-CM | POA: Diagnosis not present

## 2019-06-16 DIAGNOSIS — M545 Low back pain: Secondary | ICD-10-CM | POA: Diagnosis not present

## 2019-06-22 DIAGNOSIS — R2689 Other abnormalities of gait and mobility: Secondary | ICD-10-CM | POA: Diagnosis not present

## 2019-06-22 DIAGNOSIS — Z4789 Encounter for other orthopedic aftercare: Secondary | ICD-10-CM | POA: Diagnosis not present

## 2019-06-22 DIAGNOSIS — M545 Low back pain: Secondary | ICD-10-CM | POA: Diagnosis not present

## 2019-06-22 DIAGNOSIS — M6281 Muscle weakness (generalized): Secondary | ICD-10-CM | POA: Diagnosis not present

## 2019-06-23 DIAGNOSIS — M6281 Muscle weakness (generalized): Secondary | ICD-10-CM | POA: Diagnosis not present

## 2019-06-23 DIAGNOSIS — M545 Low back pain: Secondary | ICD-10-CM | POA: Diagnosis not present

## 2019-06-23 DIAGNOSIS — Z4789 Encounter for other orthopedic aftercare: Secondary | ICD-10-CM | POA: Diagnosis not present

## 2019-06-23 DIAGNOSIS — R2689 Other abnormalities of gait and mobility: Secondary | ICD-10-CM | POA: Diagnosis not present

## 2019-06-27 DIAGNOSIS — F5101 Primary insomnia: Secondary | ICD-10-CM | POA: Diagnosis not present

## 2019-06-27 DIAGNOSIS — M545 Low back pain: Secondary | ICD-10-CM | POA: Diagnosis not present

## 2019-06-27 DIAGNOSIS — F410 Panic disorder [episodic paroxysmal anxiety] without agoraphobia: Secondary | ICD-10-CM | POA: Diagnosis not present

## 2019-06-29 DIAGNOSIS — Z4789 Encounter for other orthopedic aftercare: Secondary | ICD-10-CM | POA: Diagnosis not present

## 2019-06-29 DIAGNOSIS — R2689 Other abnormalities of gait and mobility: Secondary | ICD-10-CM | POA: Diagnosis not present

## 2019-06-29 DIAGNOSIS — M6281 Muscle weakness (generalized): Secondary | ICD-10-CM | POA: Diagnosis not present

## 2019-06-29 DIAGNOSIS — M545 Low back pain: Secondary | ICD-10-CM | POA: Diagnosis not present

## 2019-06-30 DIAGNOSIS — M6281 Muscle weakness (generalized): Secondary | ICD-10-CM | POA: Diagnosis not present

## 2019-06-30 DIAGNOSIS — M545 Low back pain: Secondary | ICD-10-CM | POA: Diagnosis not present

## 2019-06-30 DIAGNOSIS — Z4789 Encounter for other orthopedic aftercare: Secondary | ICD-10-CM | POA: Diagnosis not present

## 2019-06-30 DIAGNOSIS — R2689 Other abnormalities of gait and mobility: Secondary | ICD-10-CM | POA: Diagnosis not present

## 2019-07-06 DIAGNOSIS — M545 Low back pain: Secondary | ICD-10-CM | POA: Diagnosis not present

## 2019-07-06 DIAGNOSIS — R2689 Other abnormalities of gait and mobility: Secondary | ICD-10-CM | POA: Diagnosis not present

## 2019-07-06 DIAGNOSIS — M6281 Muscle weakness (generalized): Secondary | ICD-10-CM | POA: Diagnosis not present

## 2019-07-06 DIAGNOSIS — Z4789 Encounter for other orthopedic aftercare: Secondary | ICD-10-CM | POA: Diagnosis not present

## 2019-07-07 DIAGNOSIS — Z4789 Encounter for other orthopedic aftercare: Secondary | ICD-10-CM | POA: Diagnosis not present

## 2019-07-07 DIAGNOSIS — R2689 Other abnormalities of gait and mobility: Secondary | ICD-10-CM | POA: Diagnosis not present

## 2019-07-07 DIAGNOSIS — M6281 Muscle weakness (generalized): Secondary | ICD-10-CM | POA: Diagnosis not present

## 2019-07-07 DIAGNOSIS — M545 Low back pain: Secondary | ICD-10-CM | POA: Diagnosis not present

## 2019-07-13 DIAGNOSIS — Z4789 Encounter for other orthopedic aftercare: Secondary | ICD-10-CM | POA: Diagnosis not present

## 2019-07-13 DIAGNOSIS — M6281 Muscle weakness (generalized): Secondary | ICD-10-CM | POA: Diagnosis not present

## 2019-07-13 DIAGNOSIS — R2689 Other abnormalities of gait and mobility: Secondary | ICD-10-CM | POA: Diagnosis not present

## 2019-07-13 DIAGNOSIS — M545 Low back pain: Secondary | ICD-10-CM | POA: Diagnosis not present

## 2019-07-14 DIAGNOSIS — M545 Low back pain: Secondary | ICD-10-CM | POA: Diagnosis not present

## 2019-07-14 DIAGNOSIS — Z4789 Encounter for other orthopedic aftercare: Secondary | ICD-10-CM | POA: Diagnosis not present

## 2019-07-14 DIAGNOSIS — M6281 Muscle weakness (generalized): Secondary | ICD-10-CM | POA: Diagnosis not present

## 2019-07-14 DIAGNOSIS — R2689 Other abnormalities of gait and mobility: Secondary | ICD-10-CM | POA: Diagnosis not present

## 2019-07-21 DIAGNOSIS — R2689 Other abnormalities of gait and mobility: Secondary | ICD-10-CM | POA: Diagnosis not present

## 2019-07-21 DIAGNOSIS — M545 Low back pain: Secondary | ICD-10-CM | POA: Diagnosis not present

## 2019-07-21 DIAGNOSIS — M6281 Muscle weakness (generalized): Secondary | ICD-10-CM | POA: Diagnosis not present

## 2019-07-21 DIAGNOSIS — Z4789 Encounter for other orthopedic aftercare: Secondary | ICD-10-CM | POA: Diagnosis not present

## 2019-07-27 DIAGNOSIS — R2689 Other abnormalities of gait and mobility: Secondary | ICD-10-CM | POA: Diagnosis not present

## 2019-07-27 DIAGNOSIS — M545 Low back pain: Secondary | ICD-10-CM | POA: Diagnosis not present

## 2019-07-27 DIAGNOSIS — Z4789 Encounter for other orthopedic aftercare: Secondary | ICD-10-CM | POA: Diagnosis not present

## 2019-07-27 DIAGNOSIS — M6281 Muscle weakness (generalized): Secondary | ICD-10-CM | POA: Diagnosis not present

## 2019-07-28 DIAGNOSIS — M25562 Pain in left knee: Secondary | ICD-10-CM | POA: Diagnosis not present

## 2019-07-28 DIAGNOSIS — Z471 Aftercare following joint replacement surgery: Secondary | ICD-10-CM | POA: Diagnosis not present

## 2019-07-28 DIAGNOSIS — Z96642 Presence of left artificial hip joint: Secondary | ICD-10-CM | POA: Diagnosis not present

## 2019-08-03 DIAGNOSIS — M545 Low back pain: Secondary | ICD-10-CM | POA: Diagnosis not present

## 2019-08-03 DIAGNOSIS — M6281 Muscle weakness (generalized): Secondary | ICD-10-CM | POA: Diagnosis not present

## 2019-08-03 DIAGNOSIS — Z4789 Encounter for other orthopedic aftercare: Secondary | ICD-10-CM | POA: Diagnosis not present

## 2019-08-03 DIAGNOSIS — R2689 Other abnormalities of gait and mobility: Secondary | ICD-10-CM | POA: Diagnosis not present

## 2019-08-04 DIAGNOSIS — M6281 Muscle weakness (generalized): Secondary | ICD-10-CM | POA: Diagnosis not present

## 2019-08-04 DIAGNOSIS — R2689 Other abnormalities of gait and mobility: Secondary | ICD-10-CM | POA: Diagnosis not present

## 2019-08-04 DIAGNOSIS — M545 Low back pain: Secondary | ICD-10-CM | POA: Diagnosis not present

## 2019-08-04 DIAGNOSIS — Z4789 Encounter for other orthopedic aftercare: Secondary | ICD-10-CM | POA: Diagnosis not present

## 2019-08-19 DIAGNOSIS — Z4789 Encounter for other orthopedic aftercare: Secondary | ICD-10-CM | POA: Diagnosis not present

## 2019-08-19 DIAGNOSIS — M545 Low back pain: Secondary | ICD-10-CM | POA: Diagnosis not present

## 2019-08-19 DIAGNOSIS — R2689 Other abnormalities of gait and mobility: Secondary | ICD-10-CM | POA: Diagnosis not present

## 2019-08-19 DIAGNOSIS — M6281 Muscle weakness (generalized): Secondary | ICD-10-CM | POA: Diagnosis not present

## 2019-08-24 DIAGNOSIS — M6281 Muscle weakness (generalized): Secondary | ICD-10-CM | POA: Diagnosis not present

## 2019-08-24 DIAGNOSIS — Z4789 Encounter for other orthopedic aftercare: Secondary | ICD-10-CM | POA: Diagnosis not present

## 2019-08-24 DIAGNOSIS — M545 Low back pain: Secondary | ICD-10-CM | POA: Diagnosis not present

## 2019-08-24 DIAGNOSIS — R2689 Other abnormalities of gait and mobility: Secondary | ICD-10-CM | POA: Diagnosis not present

## 2019-08-31 DIAGNOSIS — M545 Low back pain: Secondary | ICD-10-CM | POA: Diagnosis not present

## 2019-08-31 DIAGNOSIS — R2689 Other abnormalities of gait and mobility: Secondary | ICD-10-CM | POA: Diagnosis not present

## 2019-08-31 DIAGNOSIS — Z4789 Encounter for other orthopedic aftercare: Secondary | ICD-10-CM | POA: Diagnosis not present

## 2019-08-31 DIAGNOSIS — M6281 Muscle weakness (generalized): Secondary | ICD-10-CM | POA: Diagnosis not present

## 2019-09-01 DIAGNOSIS — Z4789 Encounter for other orthopedic aftercare: Secondary | ICD-10-CM | POA: Diagnosis not present

## 2019-09-01 DIAGNOSIS — R2689 Other abnormalities of gait and mobility: Secondary | ICD-10-CM | POA: Diagnosis not present

## 2019-09-01 DIAGNOSIS — M545 Low back pain: Secondary | ICD-10-CM | POA: Diagnosis not present

## 2019-09-01 DIAGNOSIS — M6281 Muscle weakness (generalized): Secondary | ICD-10-CM | POA: Diagnosis not present

## 2019-09-05 DIAGNOSIS — M25511 Pain in right shoulder: Secondary | ICD-10-CM | POA: Diagnosis not present

## 2019-09-07 DIAGNOSIS — M6281 Muscle weakness (generalized): Secondary | ICD-10-CM | POA: Diagnosis not present

## 2019-09-07 DIAGNOSIS — Z4789 Encounter for other orthopedic aftercare: Secondary | ICD-10-CM | POA: Diagnosis not present

## 2019-09-07 DIAGNOSIS — M545 Low back pain: Secondary | ICD-10-CM | POA: Diagnosis not present

## 2019-09-07 DIAGNOSIS — R2689 Other abnormalities of gait and mobility: Secondary | ICD-10-CM | POA: Diagnosis not present

## 2019-09-08 DIAGNOSIS — R2689 Other abnormalities of gait and mobility: Secondary | ICD-10-CM | POA: Diagnosis not present

## 2019-09-08 DIAGNOSIS — M545 Low back pain: Secondary | ICD-10-CM | POA: Diagnosis not present

## 2019-09-08 DIAGNOSIS — M6281 Muscle weakness (generalized): Secondary | ICD-10-CM | POA: Diagnosis not present

## 2019-09-08 DIAGNOSIS — Z4789 Encounter for other orthopedic aftercare: Secondary | ICD-10-CM | POA: Diagnosis not present

## 2019-09-15 DIAGNOSIS — M545 Low back pain: Secondary | ICD-10-CM | POA: Diagnosis not present

## 2019-09-15 DIAGNOSIS — Z4789 Encounter for other orthopedic aftercare: Secondary | ICD-10-CM | POA: Diagnosis not present

## 2019-09-15 DIAGNOSIS — R2689 Other abnormalities of gait and mobility: Secondary | ICD-10-CM | POA: Diagnosis not present

## 2019-09-15 DIAGNOSIS — M6281 Muscle weakness (generalized): Secondary | ICD-10-CM | POA: Diagnosis not present

## 2019-10-02 DIAGNOSIS — Z20828 Contact with and (suspected) exposure to other viral communicable diseases: Secondary | ICD-10-CM | POA: Diagnosis not present

## 2019-10-03 DIAGNOSIS — F5101 Primary insomnia: Secondary | ICD-10-CM | POA: Diagnosis not present

## 2019-10-03 DIAGNOSIS — F410 Panic disorder [episodic paroxysmal anxiety] without agoraphobia: Secondary | ICD-10-CM | POA: Diagnosis not present

## 2019-10-03 DIAGNOSIS — G8929 Other chronic pain: Secondary | ICD-10-CM | POA: Diagnosis not present

## 2019-10-19 DIAGNOSIS — Z79899 Other long term (current) drug therapy: Secondary | ICD-10-CM | POA: Diagnosis not present

## 2019-10-19 DIAGNOSIS — Z13220 Encounter for screening for lipoid disorders: Secondary | ICD-10-CM | POA: Diagnosis not present

## 2019-10-26 DIAGNOSIS — M47816 Spondylosis without myelopathy or radiculopathy, lumbar region: Secondary | ICD-10-CM | POA: Insufficient documentation

## 2019-10-26 DIAGNOSIS — M47896 Other spondylosis, lumbar region: Secondary | ICD-10-CM | POA: Diagnosis not present

## 2019-10-26 DIAGNOSIS — M5136 Other intervertebral disc degeneration, lumbar region: Secondary | ICD-10-CM | POA: Diagnosis not present

## 2019-11-18 DIAGNOSIS — M545 Low back pain: Secondary | ICD-10-CM | POA: Diagnosis not present

## 2019-11-18 DIAGNOSIS — M25552 Pain in left hip: Secondary | ICD-10-CM | POA: Diagnosis not present

## 2019-11-18 DIAGNOSIS — M6281 Muscle weakness (generalized): Secondary | ICD-10-CM | POA: Diagnosis not present

## 2019-11-18 DIAGNOSIS — R262 Difficulty in walking, not elsewhere classified: Secondary | ICD-10-CM | POA: Diagnosis not present

## 2019-11-22 DIAGNOSIS — M25552 Pain in left hip: Secondary | ICD-10-CM | POA: Diagnosis not present

## 2019-11-22 DIAGNOSIS — R262 Difficulty in walking, not elsewhere classified: Secondary | ICD-10-CM | POA: Diagnosis not present

## 2019-11-22 DIAGNOSIS — M545 Low back pain: Secondary | ICD-10-CM | POA: Diagnosis not present

## 2019-11-22 DIAGNOSIS — M6281 Muscle weakness (generalized): Secondary | ICD-10-CM | POA: Diagnosis not present

## 2019-11-24 DIAGNOSIS — R262 Difficulty in walking, not elsewhere classified: Secondary | ICD-10-CM | POA: Diagnosis not present

## 2019-11-24 DIAGNOSIS — M25552 Pain in left hip: Secondary | ICD-10-CM | POA: Diagnosis not present

## 2019-11-24 DIAGNOSIS — M545 Low back pain: Secondary | ICD-10-CM | POA: Diagnosis not present

## 2019-11-24 DIAGNOSIS — M6281 Muscle weakness (generalized): Secondary | ICD-10-CM | POA: Diagnosis not present

## 2019-11-29 DIAGNOSIS — M25552 Pain in left hip: Secondary | ICD-10-CM | POA: Diagnosis not present

## 2019-11-29 DIAGNOSIS — M6281 Muscle weakness (generalized): Secondary | ICD-10-CM | POA: Diagnosis not present

## 2019-11-29 DIAGNOSIS — M545 Low back pain: Secondary | ICD-10-CM | POA: Diagnosis not present

## 2019-11-29 DIAGNOSIS — R262 Difficulty in walking, not elsewhere classified: Secondary | ICD-10-CM | POA: Diagnosis not present

## 2019-12-01 DIAGNOSIS — M545 Low back pain: Secondary | ICD-10-CM | POA: Diagnosis not present

## 2019-12-01 DIAGNOSIS — R262 Difficulty in walking, not elsewhere classified: Secondary | ICD-10-CM | POA: Diagnosis not present

## 2019-12-01 DIAGNOSIS — M25552 Pain in left hip: Secondary | ICD-10-CM | POA: Diagnosis not present

## 2019-12-01 DIAGNOSIS — M6281 Muscle weakness (generalized): Secondary | ICD-10-CM | POA: Diagnosis not present

## 2019-12-06 DIAGNOSIS — M47816 Spondylosis without myelopathy or radiculopathy, lumbar region: Secondary | ICD-10-CM | POA: Diagnosis not present

## 2019-12-13 DIAGNOSIS — M545 Low back pain: Secondary | ICD-10-CM | POA: Diagnosis not present

## 2019-12-13 DIAGNOSIS — M6281 Muscle weakness (generalized): Secondary | ICD-10-CM | POA: Diagnosis not present

## 2019-12-13 DIAGNOSIS — R262 Difficulty in walking, not elsewhere classified: Secondary | ICD-10-CM | POA: Diagnosis not present

## 2019-12-13 DIAGNOSIS — M25552 Pain in left hip: Secondary | ICD-10-CM | POA: Diagnosis not present

## 2019-12-15 DIAGNOSIS — R262 Difficulty in walking, not elsewhere classified: Secondary | ICD-10-CM | POA: Diagnosis not present

## 2019-12-15 DIAGNOSIS — M6281 Muscle weakness (generalized): Secondary | ICD-10-CM | POA: Diagnosis not present

## 2019-12-15 DIAGNOSIS — M545 Low back pain: Secondary | ICD-10-CM | POA: Diagnosis not present

## 2019-12-15 DIAGNOSIS — M25552 Pain in left hip: Secondary | ICD-10-CM | POA: Diagnosis not present

## 2019-12-20 DIAGNOSIS — R262 Difficulty in walking, not elsewhere classified: Secondary | ICD-10-CM | POA: Diagnosis not present

## 2019-12-20 DIAGNOSIS — M6281 Muscle weakness (generalized): Secondary | ICD-10-CM | POA: Diagnosis not present

## 2019-12-20 DIAGNOSIS — M545 Low back pain: Secondary | ICD-10-CM | POA: Diagnosis not present

## 2019-12-20 DIAGNOSIS — M25552 Pain in left hip: Secondary | ICD-10-CM | POA: Diagnosis not present

## 2019-12-22 DIAGNOSIS — M25552 Pain in left hip: Secondary | ICD-10-CM | POA: Diagnosis not present

## 2019-12-22 DIAGNOSIS — R262 Difficulty in walking, not elsewhere classified: Secondary | ICD-10-CM | POA: Diagnosis not present

## 2019-12-22 DIAGNOSIS — M47816 Spondylosis without myelopathy or radiculopathy, lumbar region: Secondary | ICD-10-CM | POA: Diagnosis not present

## 2019-12-22 DIAGNOSIS — M545 Low back pain: Secondary | ICD-10-CM | POA: Diagnosis not present

## 2019-12-22 DIAGNOSIS — M6281 Muscle weakness (generalized): Secondary | ICD-10-CM | POA: Diagnosis not present

## 2019-12-27 DIAGNOSIS — M6281 Muscle weakness (generalized): Secondary | ICD-10-CM | POA: Diagnosis not present

## 2019-12-27 DIAGNOSIS — M25552 Pain in left hip: Secondary | ICD-10-CM | POA: Diagnosis not present

## 2019-12-27 DIAGNOSIS — R262 Difficulty in walking, not elsewhere classified: Secondary | ICD-10-CM | POA: Diagnosis not present

## 2019-12-27 DIAGNOSIS — M545 Low back pain: Secondary | ICD-10-CM | POA: Diagnosis not present

## 2019-12-28 DIAGNOSIS — Z20828 Contact with and (suspected) exposure to other viral communicable diseases: Secondary | ICD-10-CM | POA: Diagnosis not present

## 2020-01-03 DIAGNOSIS — M25552 Pain in left hip: Secondary | ICD-10-CM | POA: Diagnosis not present

## 2020-01-03 DIAGNOSIS — M545 Low back pain: Secondary | ICD-10-CM | POA: Diagnosis not present

## 2020-01-03 DIAGNOSIS — M6281 Muscle weakness (generalized): Secondary | ICD-10-CM | POA: Diagnosis not present

## 2020-01-03 DIAGNOSIS — R262 Difficulty in walking, not elsewhere classified: Secondary | ICD-10-CM | POA: Diagnosis not present

## 2020-01-05 DIAGNOSIS — R262 Difficulty in walking, not elsewhere classified: Secondary | ICD-10-CM | POA: Diagnosis not present

## 2020-01-05 DIAGNOSIS — M6281 Muscle weakness (generalized): Secondary | ICD-10-CM | POA: Diagnosis not present

## 2020-01-05 DIAGNOSIS — M545 Low back pain: Secondary | ICD-10-CM | POA: Diagnosis not present

## 2020-01-05 DIAGNOSIS — M25552 Pain in left hip: Secondary | ICD-10-CM | POA: Diagnosis not present

## 2020-01-10 DIAGNOSIS — M6281 Muscle weakness (generalized): Secondary | ICD-10-CM | POA: Diagnosis not present

## 2020-01-10 DIAGNOSIS — R262 Difficulty in walking, not elsewhere classified: Secondary | ICD-10-CM | POA: Diagnosis not present

## 2020-01-10 DIAGNOSIS — M545 Low back pain: Secondary | ICD-10-CM | POA: Diagnosis not present

## 2020-01-10 DIAGNOSIS — M25552 Pain in left hip: Secondary | ICD-10-CM | POA: Diagnosis not present

## 2020-01-12 DIAGNOSIS — M25552 Pain in left hip: Secondary | ICD-10-CM | POA: Diagnosis not present

## 2020-01-12 DIAGNOSIS — R262 Difficulty in walking, not elsewhere classified: Secondary | ICD-10-CM | POA: Diagnosis not present

## 2020-01-12 DIAGNOSIS — M6281 Muscle weakness (generalized): Secondary | ICD-10-CM | POA: Diagnosis not present

## 2020-01-12 DIAGNOSIS — M545 Low back pain: Secondary | ICD-10-CM | POA: Diagnosis not present

## 2020-01-17 DIAGNOSIS — R262 Difficulty in walking, not elsewhere classified: Secondary | ICD-10-CM | POA: Diagnosis not present

## 2020-01-17 DIAGNOSIS — M6281 Muscle weakness (generalized): Secondary | ICD-10-CM | POA: Diagnosis not present

## 2020-01-17 DIAGNOSIS — M25552 Pain in left hip: Secondary | ICD-10-CM | POA: Diagnosis not present

## 2020-01-17 DIAGNOSIS — M545 Low back pain: Secondary | ICD-10-CM | POA: Diagnosis not present

## 2020-01-24 DIAGNOSIS — M6281 Muscle weakness (generalized): Secondary | ICD-10-CM | POA: Diagnosis not present

## 2020-01-24 DIAGNOSIS — M25552 Pain in left hip: Secondary | ICD-10-CM | POA: Diagnosis not present

## 2020-01-24 DIAGNOSIS — R262 Difficulty in walking, not elsewhere classified: Secondary | ICD-10-CM | POA: Diagnosis not present

## 2020-01-24 DIAGNOSIS — M545 Low back pain: Secondary | ICD-10-CM | POA: Diagnosis not present

## 2020-01-26 DIAGNOSIS — M545 Low back pain: Secondary | ICD-10-CM | POA: Diagnosis not present

## 2020-01-26 DIAGNOSIS — R262 Difficulty in walking, not elsewhere classified: Secondary | ICD-10-CM | POA: Diagnosis not present

## 2020-01-26 DIAGNOSIS — M25552 Pain in left hip: Secondary | ICD-10-CM | POA: Diagnosis not present

## 2020-01-26 DIAGNOSIS — M6281 Muscle weakness (generalized): Secondary | ICD-10-CM | POA: Diagnosis not present

## 2020-01-31 DIAGNOSIS — R262 Difficulty in walking, not elsewhere classified: Secondary | ICD-10-CM | POA: Diagnosis not present

## 2020-01-31 DIAGNOSIS — M545 Low back pain: Secondary | ICD-10-CM | POA: Diagnosis not present

## 2020-01-31 DIAGNOSIS — M25552 Pain in left hip: Secondary | ICD-10-CM | POA: Diagnosis not present

## 2020-01-31 DIAGNOSIS — M6281 Muscle weakness (generalized): Secondary | ICD-10-CM | POA: Diagnosis not present

## 2020-02-02 DIAGNOSIS — M25552 Pain in left hip: Secondary | ICD-10-CM | POA: Diagnosis not present

## 2020-02-02 DIAGNOSIS — M545 Low back pain: Secondary | ICD-10-CM | POA: Diagnosis not present

## 2020-02-02 DIAGNOSIS — R262 Difficulty in walking, not elsewhere classified: Secondary | ICD-10-CM | POA: Diagnosis not present

## 2020-02-02 DIAGNOSIS — M6281 Muscle weakness (generalized): Secondary | ICD-10-CM | POA: Diagnosis not present

## 2020-02-09 DIAGNOSIS — M25552 Pain in left hip: Secondary | ICD-10-CM | POA: Diagnosis not present

## 2020-02-09 DIAGNOSIS — M545 Low back pain: Secondary | ICD-10-CM | POA: Diagnosis not present

## 2020-02-09 DIAGNOSIS — R262 Difficulty in walking, not elsewhere classified: Secondary | ICD-10-CM | POA: Diagnosis not present

## 2020-02-09 DIAGNOSIS — M6281 Muscle weakness (generalized): Secondary | ICD-10-CM | POA: Diagnosis not present

## 2020-02-15 DIAGNOSIS — M6281 Muscle weakness (generalized): Secondary | ICD-10-CM | POA: Diagnosis not present

## 2020-02-15 DIAGNOSIS — M25552 Pain in left hip: Secondary | ICD-10-CM | POA: Diagnosis not present

## 2020-02-15 DIAGNOSIS — R262 Difficulty in walking, not elsewhere classified: Secondary | ICD-10-CM | POA: Diagnosis not present

## 2020-02-15 DIAGNOSIS — M545 Low back pain: Secondary | ICD-10-CM | POA: Diagnosis not present

## 2020-02-29 DIAGNOSIS — R262 Difficulty in walking, not elsewhere classified: Secondary | ICD-10-CM | POA: Diagnosis not present

## 2020-02-29 DIAGNOSIS — M6281 Muscle weakness (generalized): Secondary | ICD-10-CM | POA: Diagnosis not present

## 2020-02-29 DIAGNOSIS — M545 Low back pain: Secondary | ICD-10-CM | POA: Diagnosis not present

## 2020-02-29 DIAGNOSIS — M25552 Pain in left hip: Secondary | ICD-10-CM | POA: Diagnosis not present

## 2020-03-07 DIAGNOSIS — M545 Low back pain: Secondary | ICD-10-CM | POA: Diagnosis not present

## 2020-03-07 DIAGNOSIS — M25552 Pain in left hip: Secondary | ICD-10-CM | POA: Diagnosis not present

## 2020-03-07 DIAGNOSIS — M6281 Muscle weakness (generalized): Secondary | ICD-10-CM | POA: Diagnosis not present

## 2020-03-07 DIAGNOSIS — R262 Difficulty in walking, not elsewhere classified: Secondary | ICD-10-CM | POA: Diagnosis not present

## 2020-03-16 DIAGNOSIS — M25552 Pain in left hip: Secondary | ICD-10-CM | POA: Diagnosis not present

## 2020-03-16 DIAGNOSIS — Z79891 Long term (current) use of opiate analgesic: Secondary | ICD-10-CM | POA: Diagnosis not present

## 2020-03-16 DIAGNOSIS — M5136 Other intervertebral disc degeneration, lumbar region: Secondary | ICD-10-CM | POA: Diagnosis not present

## 2020-03-16 DIAGNOSIS — M47896 Other spondylosis, lumbar region: Secondary | ICD-10-CM | POA: Diagnosis not present

## 2020-03-28 DIAGNOSIS — M6281 Muscle weakness (generalized): Secondary | ICD-10-CM | POA: Diagnosis not present

## 2020-03-28 DIAGNOSIS — M545 Low back pain: Secondary | ICD-10-CM | POA: Diagnosis not present

## 2020-03-28 DIAGNOSIS — M25552 Pain in left hip: Secondary | ICD-10-CM | POA: Diagnosis not present

## 2020-03-28 DIAGNOSIS — R262 Difficulty in walking, not elsewhere classified: Secondary | ICD-10-CM | POA: Diagnosis not present

## 2020-04-05 DIAGNOSIS — M545 Low back pain: Secondary | ICD-10-CM | POA: Diagnosis not present

## 2020-04-05 DIAGNOSIS — M25552 Pain in left hip: Secondary | ICD-10-CM | POA: Diagnosis not present

## 2020-04-05 DIAGNOSIS — M6281 Muscle weakness (generalized): Secondary | ICD-10-CM | POA: Diagnosis not present

## 2020-04-05 DIAGNOSIS — R262 Difficulty in walking, not elsewhere classified: Secondary | ICD-10-CM | POA: Diagnosis not present

## 2020-04-11 DIAGNOSIS — M545 Low back pain: Secondary | ICD-10-CM | POA: Diagnosis not present

## 2020-04-11 DIAGNOSIS — M25552 Pain in left hip: Secondary | ICD-10-CM | POA: Diagnosis not present

## 2020-04-11 DIAGNOSIS — R262 Difficulty in walking, not elsewhere classified: Secondary | ICD-10-CM | POA: Diagnosis not present

## 2020-04-11 DIAGNOSIS — M6281 Muscle weakness (generalized): Secondary | ICD-10-CM | POA: Diagnosis not present

## 2020-04-13 DIAGNOSIS — M545 Low back pain: Secondary | ICD-10-CM | POA: Diagnosis not present

## 2020-04-13 DIAGNOSIS — M62830 Muscle spasm of back: Secondary | ICD-10-CM | POA: Diagnosis not present

## 2020-04-18 DIAGNOSIS — M6281 Muscle weakness (generalized): Secondary | ICD-10-CM | POA: Diagnosis not present

## 2020-04-18 DIAGNOSIS — M25552 Pain in left hip: Secondary | ICD-10-CM | POA: Diagnosis not present

## 2020-04-18 DIAGNOSIS — R262 Difficulty in walking, not elsewhere classified: Secondary | ICD-10-CM | POA: Diagnosis not present

## 2020-04-18 DIAGNOSIS — M545 Low back pain: Secondary | ICD-10-CM | POA: Diagnosis not present

## 2020-04-24 DIAGNOSIS — M5136 Other intervertebral disc degeneration, lumbar region: Secondary | ICD-10-CM | POA: Diagnosis not present

## 2020-05-02 DIAGNOSIS — M545 Low back pain: Secondary | ICD-10-CM | POA: Diagnosis not present

## 2020-05-02 DIAGNOSIS — M6281 Muscle weakness (generalized): Secondary | ICD-10-CM | POA: Diagnosis not present

## 2020-05-02 DIAGNOSIS — M25552 Pain in left hip: Secondary | ICD-10-CM | POA: Diagnosis not present

## 2020-05-02 DIAGNOSIS — R262 Difficulty in walking, not elsewhere classified: Secondary | ICD-10-CM | POA: Diagnosis not present

## 2020-05-16 DIAGNOSIS — M6281 Muscle weakness (generalized): Secondary | ICD-10-CM | POA: Diagnosis not present

## 2020-05-16 DIAGNOSIS — M25552 Pain in left hip: Secondary | ICD-10-CM | POA: Diagnosis not present

## 2020-05-16 DIAGNOSIS — M545 Low back pain: Secondary | ICD-10-CM | POA: Diagnosis not present

## 2020-05-16 DIAGNOSIS — R262 Difficulty in walking, not elsewhere classified: Secondary | ICD-10-CM | POA: Diagnosis not present

## 2020-05-23 DIAGNOSIS — M6281 Muscle weakness (generalized): Secondary | ICD-10-CM | POA: Diagnosis not present

## 2020-05-23 DIAGNOSIS — M25552 Pain in left hip: Secondary | ICD-10-CM | POA: Diagnosis not present

## 2020-05-23 DIAGNOSIS — M545 Low back pain: Secondary | ICD-10-CM | POA: Diagnosis not present

## 2020-05-23 DIAGNOSIS — R262 Difficulty in walking, not elsewhere classified: Secondary | ICD-10-CM | POA: Diagnosis not present

## 2020-05-30 DIAGNOSIS — M6281 Muscle weakness (generalized): Secondary | ICD-10-CM | POA: Diagnosis not present

## 2020-05-30 DIAGNOSIS — R262 Difficulty in walking, not elsewhere classified: Secondary | ICD-10-CM | POA: Diagnosis not present

## 2020-05-30 DIAGNOSIS — M545 Low back pain: Secondary | ICD-10-CM | POA: Diagnosis not present

## 2020-05-30 DIAGNOSIS — M25552 Pain in left hip: Secondary | ICD-10-CM | POA: Diagnosis not present

## 2020-06-06 DIAGNOSIS — M25552 Pain in left hip: Secondary | ICD-10-CM | POA: Diagnosis not present

## 2020-06-06 DIAGNOSIS — M6281 Muscle weakness (generalized): Secondary | ICD-10-CM | POA: Diagnosis not present

## 2020-06-06 DIAGNOSIS — M545 Low back pain: Secondary | ICD-10-CM | POA: Diagnosis not present

## 2020-06-06 DIAGNOSIS — R262 Difficulty in walking, not elsewhere classified: Secondary | ICD-10-CM | POA: Diagnosis not present

## 2020-06-24 DIAGNOSIS — Z20822 Contact with and (suspected) exposure to covid-19: Secondary | ICD-10-CM | POA: Diagnosis not present

## 2020-06-27 DIAGNOSIS — M6281 Muscle weakness (generalized): Secondary | ICD-10-CM | POA: Diagnosis not present

## 2020-06-27 DIAGNOSIS — R262 Difficulty in walking, not elsewhere classified: Secondary | ICD-10-CM | POA: Diagnosis not present

## 2020-06-27 DIAGNOSIS — M545 Low back pain: Secondary | ICD-10-CM | POA: Diagnosis not present

## 2020-06-27 DIAGNOSIS — M25552 Pain in left hip: Secondary | ICD-10-CM | POA: Diagnosis not present

## 2020-07-04 DIAGNOSIS — M6281 Muscle weakness (generalized): Secondary | ICD-10-CM | POA: Diagnosis not present

## 2020-07-04 DIAGNOSIS — M25552 Pain in left hip: Secondary | ICD-10-CM | POA: Diagnosis not present

## 2020-07-04 DIAGNOSIS — R262 Difficulty in walking, not elsewhere classified: Secondary | ICD-10-CM | POA: Diagnosis not present

## 2020-07-04 DIAGNOSIS — M545 Low back pain: Secondary | ICD-10-CM | POA: Diagnosis not present

## 2020-07-10 DIAGNOSIS — M545 Low back pain: Secondary | ICD-10-CM | POA: Diagnosis not present

## 2020-07-11 DIAGNOSIS — R262 Difficulty in walking, not elsewhere classified: Secondary | ICD-10-CM | POA: Diagnosis not present

## 2020-07-11 DIAGNOSIS — M6281 Muscle weakness (generalized): Secondary | ICD-10-CM | POA: Diagnosis not present

## 2020-07-11 DIAGNOSIS — M25552 Pain in left hip: Secondary | ICD-10-CM | POA: Diagnosis not present

## 2020-07-11 DIAGNOSIS — M545 Low back pain: Secondary | ICD-10-CM | POA: Diagnosis not present

## 2020-07-18 DIAGNOSIS — F410 Panic disorder [episodic paroxysmal anxiety] without agoraphobia: Secondary | ICD-10-CM | POA: Diagnosis not present

## 2020-07-18 DIAGNOSIS — R262 Difficulty in walking, not elsewhere classified: Secondary | ICD-10-CM | POA: Diagnosis not present

## 2020-07-18 DIAGNOSIS — R5383 Other fatigue: Secondary | ICD-10-CM | POA: Diagnosis not present

## 2020-07-18 DIAGNOSIS — M25552 Pain in left hip: Secondary | ICD-10-CM | POA: Diagnosis not present

## 2020-07-18 DIAGNOSIS — G4733 Obstructive sleep apnea (adult) (pediatric): Secondary | ICD-10-CM | POA: Diagnosis not present

## 2020-07-18 DIAGNOSIS — M6281 Muscle weakness (generalized): Secondary | ICD-10-CM | POA: Diagnosis not present

## 2020-07-18 DIAGNOSIS — M545 Low back pain: Secondary | ICD-10-CM | POA: Diagnosis not present

## 2020-07-20 DIAGNOSIS — R5383 Other fatigue: Secondary | ICD-10-CM | POA: Diagnosis not present

## 2020-07-20 DIAGNOSIS — Z79899 Other long term (current) drug therapy: Secondary | ICD-10-CM | POA: Diagnosis not present

## 2020-07-20 DIAGNOSIS — Z13220 Encounter for screening for lipoid disorders: Secondary | ICD-10-CM | POA: Diagnosis not present

## 2020-07-25 DIAGNOSIS — R262 Difficulty in walking, not elsewhere classified: Secondary | ICD-10-CM | POA: Diagnosis not present

## 2020-07-25 DIAGNOSIS — M545 Low back pain: Secondary | ICD-10-CM | POA: Diagnosis not present

## 2020-07-25 DIAGNOSIS — M6281 Muscle weakness (generalized): Secondary | ICD-10-CM | POA: Diagnosis not present

## 2020-07-25 DIAGNOSIS — M25552 Pain in left hip: Secondary | ICD-10-CM | POA: Diagnosis not present

## 2020-07-30 DIAGNOSIS — Z79899 Other long term (current) drug therapy: Secondary | ICD-10-CM | POA: Diagnosis not present

## 2020-07-30 DIAGNOSIS — D509 Iron deficiency anemia, unspecified: Secondary | ICD-10-CM | POA: Diagnosis not present

## 2020-08-01 DIAGNOSIS — M6281 Muscle weakness (generalized): Secondary | ICD-10-CM | POA: Diagnosis not present

## 2020-08-01 DIAGNOSIS — M545 Low back pain: Secondary | ICD-10-CM | POA: Diagnosis not present

## 2020-08-01 DIAGNOSIS — R262 Difficulty in walking, not elsewhere classified: Secondary | ICD-10-CM | POA: Diagnosis not present

## 2020-08-01 DIAGNOSIS — M25552 Pain in left hip: Secondary | ICD-10-CM | POA: Diagnosis not present

## 2020-08-15 DIAGNOSIS — M545 Low back pain, unspecified: Secondary | ICD-10-CM | POA: Diagnosis not present

## 2020-08-15 DIAGNOSIS — R262 Difficulty in walking, not elsewhere classified: Secondary | ICD-10-CM | POA: Diagnosis not present

## 2020-08-15 DIAGNOSIS — M6281 Muscle weakness (generalized): Secondary | ICD-10-CM | POA: Diagnosis not present

## 2020-08-15 DIAGNOSIS — M25552 Pain in left hip: Secondary | ICD-10-CM | POA: Diagnosis not present

## 2020-08-29 DIAGNOSIS — R262 Difficulty in walking, not elsewhere classified: Secondary | ICD-10-CM | POA: Diagnosis not present

## 2020-08-29 DIAGNOSIS — M25552 Pain in left hip: Secondary | ICD-10-CM | POA: Diagnosis not present

## 2020-08-29 DIAGNOSIS — M6281 Muscle weakness (generalized): Secondary | ICD-10-CM | POA: Diagnosis not present

## 2020-08-29 DIAGNOSIS — M545 Low back pain, unspecified: Secondary | ICD-10-CM | POA: Diagnosis not present

## 2020-09-05 DIAGNOSIS — M25552 Pain in left hip: Secondary | ICD-10-CM | POA: Diagnosis not present

## 2020-09-05 DIAGNOSIS — R262 Difficulty in walking, not elsewhere classified: Secondary | ICD-10-CM | POA: Diagnosis not present

## 2020-09-05 DIAGNOSIS — M545 Low back pain, unspecified: Secondary | ICD-10-CM | POA: Diagnosis not present

## 2020-09-05 DIAGNOSIS — M6281 Muscle weakness (generalized): Secondary | ICD-10-CM | POA: Diagnosis not present

## 2020-09-10 DIAGNOSIS — Z01419 Encounter for gynecological examination (general) (routine) without abnormal findings: Secondary | ICD-10-CM | POA: Diagnosis not present

## 2020-09-10 DIAGNOSIS — R61 Generalized hyperhidrosis: Secondary | ICD-10-CM | POA: Diagnosis not present

## 2020-09-10 DIAGNOSIS — Z1231 Encounter for screening mammogram for malignant neoplasm of breast: Secondary | ICD-10-CM | POA: Diagnosis not present

## 2020-09-10 DIAGNOSIS — Z6822 Body mass index (BMI) 22.0-22.9, adult: Secondary | ICD-10-CM | POA: Diagnosis not present

## 2020-09-10 DIAGNOSIS — Z1151 Encounter for screening for human papillomavirus (HPV): Secondary | ICD-10-CM | POA: Diagnosis not present

## 2020-09-12 ENCOUNTER — Other Ambulatory Visit: Payer: Self-pay | Admitting: Obstetrics & Gynecology

## 2020-09-12 DIAGNOSIS — R928 Other abnormal and inconclusive findings on diagnostic imaging of breast: Secondary | ICD-10-CM

## 2020-09-13 DIAGNOSIS — M25552 Pain in left hip: Secondary | ICD-10-CM | POA: Diagnosis not present

## 2020-09-13 DIAGNOSIS — R262 Difficulty in walking, not elsewhere classified: Secondary | ICD-10-CM | POA: Diagnosis not present

## 2020-09-13 DIAGNOSIS — M6281 Muscle weakness (generalized): Secondary | ICD-10-CM | POA: Diagnosis not present

## 2020-09-19 DIAGNOSIS — M5136 Other intervertebral disc degeneration, lumbar region: Secondary | ICD-10-CM | POA: Diagnosis not present

## 2020-09-19 DIAGNOSIS — R262 Difficulty in walking, not elsewhere classified: Secondary | ICD-10-CM | POA: Diagnosis not present

## 2020-09-19 DIAGNOSIS — M6281 Muscle weakness (generalized): Secondary | ICD-10-CM | POA: Diagnosis not present

## 2020-09-19 DIAGNOSIS — M25552 Pain in left hip: Secondary | ICD-10-CM | POA: Diagnosis not present

## 2020-09-19 DIAGNOSIS — M47816 Spondylosis without myelopathy or radiculopathy, lumbar region: Secondary | ICD-10-CM | POA: Diagnosis not present

## 2020-09-26 DIAGNOSIS — M25552 Pain in left hip: Secondary | ICD-10-CM | POA: Diagnosis not present

## 2020-09-26 DIAGNOSIS — R262 Difficulty in walking, not elsewhere classified: Secondary | ICD-10-CM | POA: Diagnosis not present

## 2020-09-26 DIAGNOSIS — M6281 Muscle weakness (generalized): Secondary | ICD-10-CM | POA: Diagnosis not present

## 2020-09-27 ENCOUNTER — Other Ambulatory Visit: Payer: Self-pay

## 2020-09-27 ENCOUNTER — Other Ambulatory Visit: Payer: Self-pay | Admitting: Obstetrics & Gynecology

## 2020-09-27 ENCOUNTER — Ambulatory Visit
Admission: RE | Admit: 2020-09-27 | Discharge: 2020-09-27 | Disposition: A | Discharge status: Home / Self Care | Payer: BC Managed Care – PPO | Source: Ambulatory Visit | Attending: Obstetrics & Gynecology | Admitting: Obstetrics & Gynecology

## 2020-09-27 ENCOUNTER — Ambulatory Visit
Admission: RE | Admit: 2020-09-27 | Discharge: 2020-09-27 | Disposition: A | Discharge status: Home / Self Care | Payer: Self-pay | Source: Ambulatory Visit | Attending: Obstetrics & Gynecology | Admitting: Obstetrics & Gynecology

## 2020-09-27 DIAGNOSIS — R928 Other abnormal and inconclusive findings on diagnostic imaging of breast: Secondary | ICD-10-CM

## 2020-09-27 DIAGNOSIS — R922 Inconclusive mammogram: Secondary | ICD-10-CM | POA: Diagnosis not present

## 2020-09-27 DIAGNOSIS — N6322 Unspecified lump in the left breast, upper inner quadrant: Secondary | ICD-10-CM | POA: Diagnosis not present

## 2020-10-03 DIAGNOSIS — M6281 Muscle weakness (generalized): Secondary | ICD-10-CM | POA: Diagnosis not present

## 2020-10-03 DIAGNOSIS — R262 Difficulty in walking, not elsewhere classified: Secondary | ICD-10-CM | POA: Diagnosis not present

## 2020-10-03 DIAGNOSIS — M25552 Pain in left hip: Secondary | ICD-10-CM | POA: Diagnosis not present

## 2020-10-09 DIAGNOSIS — M47816 Spondylosis without myelopathy or radiculopathy, lumbar region: Secondary | ICD-10-CM | POA: Diagnosis not present

## 2020-10-18 DIAGNOSIS — D563 Thalassemia minor: Secondary | ICD-10-CM | POA: Diagnosis not present

## 2020-10-18 DIAGNOSIS — Z79899 Other long term (current) drug therapy: Secondary | ICD-10-CM | POA: Diagnosis not present

## 2020-10-22 DIAGNOSIS — N920 Excessive and frequent menstruation with regular cycle: Secondary | ICD-10-CM | POA: Diagnosis not present

## 2020-10-23 DIAGNOSIS — M25552 Pain in left hip: Secondary | ICD-10-CM | POA: Diagnosis not present

## 2020-10-23 DIAGNOSIS — R262 Difficulty in walking, not elsewhere classified: Secondary | ICD-10-CM | POA: Diagnosis not present

## 2020-10-23 DIAGNOSIS — M6281 Muscle weakness (generalized): Secondary | ICD-10-CM | POA: Diagnosis not present

## 2020-11-01 ENCOUNTER — Other Ambulatory Visit: Payer: Self-pay

## 2020-11-01 ENCOUNTER — Ambulatory Visit: Payer: BC Managed Care – PPO | Admitting: Sports Medicine

## 2020-11-01 VITALS — BP 144/90 | Ht 64.0 in | Wt 130.0 lb

## 2020-11-01 DIAGNOSIS — M545 Low back pain, unspecified: Secondary | ICD-10-CM

## 2020-11-01 DIAGNOSIS — G8929 Other chronic pain: Secondary | ICD-10-CM

## 2020-11-01 DIAGNOSIS — M25552 Pain in left hip: Secondary | ICD-10-CM | POA: Diagnosis not present

## 2020-11-01 DIAGNOSIS — M2170 Unequal limb length (acquired), unspecified site: Secondary | ICD-10-CM

## 2020-11-01 DIAGNOSIS — Z96642 Presence of left artificial hip joint: Secondary | ICD-10-CM | POA: Diagnosis not present

## 2020-11-01 DIAGNOSIS — R269 Unspecified abnormalities of gait and mobility: Secondary | ICD-10-CM

## 2020-11-01 MED ORDER — CYCLOBENZAPRINE HCL 10 MG PO TABS: 10.0000 mg | ORAL_TABLET | Freq: Every evening | ORAL | 0 refills | Status: DC | PRN

## 2020-11-01 MED ORDER — CYCLOBENZAPRINE HCL 5 MG PO TABS: 5.0000 mg | ORAL_TABLET | Freq: Every day | ORAL | 0 refills | Status: DC

## 2020-11-01 NOTE — Progress Notes (Signed)
Office Visit Note   Patient: Raven Phillips           Date of Birth: [birth year]           MRN: [id number] Visit Date: 11/01/2020 Requested by: No referring provider defined for this encounter. PCP: Pcp, No  Subjective: CC: Low Back Pain  HPI: Patient is a pleasant 44 year old female presenting to clinic today with concerns of several years of left sided lower back pain.  Her medical history is quite complex, including left hip dysplasia in her youth requiring a hip replacement in 2019.  Additionally, patient states that she grew up with her legs being significantly different in length, and they attempted to do a procedure to remove 2 inches from her right femur to correct this.  However, after her hip replacement they tried to lengthen the left leg, and this left her feeling somewhat offkilter.  She says that her back pain has been persistent ever since her hip replacement in 2019, despite multiple ESI's, steroid injections, radiofrequency ablations to her lumbar spine.  She has tried doing physical therapy, however this was interrupted by the Covid pandemic.  In the past few months, she has gone once weekly, with a focus on her lower back.  She confesses that she has difficulty working on her hip strength as her back pain takes priority.  She has 3 young children at home, and struggles to keep up with him throughout the day.  She says that she has difficulty getting comfortable with sitting, and has to lay down to find comfort.  She has to sleep with a pillow between her knees as she feels as though her hip will rotate too much when she lays on her side.  She tries to walk her dog 5 to 10 minutes daily, but cannot tolerate walking further distances due to hip and back pain.  She has had multiple MRIs, which have demonstrated moderate to severe arthropathy and L5-S1.  Last radiofrequency ablation was performed approximately 4 weeks ago, and she says that this has actually worsened her pain and caused a  "burning sensation" within the left SI joint.  Pain does not radiate down her leg, she denies any numbness or weakness in either leg.  No bowel or bladder dysfunction.              ROS:   All other systems were reviewed and are negative.  Objective: Vital Signs: BP (!) 144/90   Ht 5\' 4"  (1.626 m)   Wt 130 lb (59 kg)   BMI 22.31 kg/m   Physical Exam:  General:  Alert and oriented, in no acute distress. Pulm:  Breathing unlabored. Psy:  Normal mood, congruent affect. Skin: Lower back and hips with no bruising, rashes, or erythema.  Overlying skin intact. Back Exam:  No excessive lumbar lordosis or thoracic kyphosis, however patient does have a subtle dextroscoliotic curve, which improves with back extension. When standing, patient's right ASIS is approximately 2 cm lower than her left.  Gait evaluation: Patient has a Trendelenburg sway to the right, she walks with femoral anteversion.   Significant pes planus bilaterally upon standing.  Patient's right quad, hamstrings, and gluteal musculature well developed  on the left all appear less defined when compared to the right. When supine, right ASIS appears approximately 2 cm distal to left.  Malleoli well approximated.  Right leg measures approximately 84.5 cm, compared to 86 cm on the left.   Range of motion assessment:  Approximately 4  inches from toe-touch with forward flexion.  Able to extend to approximately 10 degrees prior to limitations due to pain.  Rotation and side bending limited bilaterally due to lower back pain. Left hip range of motion markedly reduced with internal rotation.  External rotation also reduced when compared to right. FABER is limited No significant pain with FADIR, however does endorse SI pain with FABER bilaterally.   Week gluteal abduction, internal and Ext rotation on the left when compared to the right.  Faills two finger testing on the left, however passes on right and right shows normal strength to hip  abduction Weak on left hip flexion  Straight leg raise negative bilaterally. Sensation intact throughout bilateral lower extremities.   Imaging: Previous MRI results from outside providers reviewed.  Significant for moderate degenerative arthropathy at L4-L5, as well as severe arthropathy at L5-S1.  Assessment & Plan: 44 year old female with a past medical history complicated by hip dysplasia, requiring a hip replacement in 2019 who has had lower back pain ever since the surgical date.  Examination with multiple biomechanical abnormalities, including a shorter right leg, rotated pelvis, femoral anteversion, and significant bilateral pes planus.  Additionally, patient is weaker throughout the gluteal and quadriceps muscles when compared to her unaffected side.  -Provided with sports insoles today, with scaphoid padding on the left as well as a heel lift on the right. -We will start conservative care with gluteal exercises to strengthen her pelvic stabilizers.  Provided with HEP today. -Return to clinic in 4 to 6 weeks for reevaluation.  -Expectation management discussed with patient, especially due to the chronicity of her pain and multiple surgeries in the past.  However, optimistic that we can get her more functional than her current state. -Patient expresses understanding with plan, she has no further questions or concerns today.     Procedures: No procedures performed     I observed and examined the patient with Dr. Marga Hoots and agree with assessment and plan.  Note reviewed and modified by me. Sterling Big, MD

## 2020-11-06 ENCOUNTER — Telehealth: Payer: Self-pay | Admitting: Hematology and Oncology

## 2020-11-06 NOTE — Telephone Encounter (Signed)
Received a new hem referral from Dr. Docia Chuck for IDA. Ms. Raven Phillips returned my call and has been scheduled to see Dr. Al Pimple on 1/11 at 10am. Pt aware to arrive 30 minutes early.

## 2020-11-18 NOTE — Progress Notes (Signed)
[facility] CONSULT NOTE  Patient Care Team: Pcp, No as PCP - General  CHIEF COMPLAINTS/PURPOSE OF CONSULTATION:  Iron deficiency anemia and thalassemia minor.  ASSESSMENT & PLAN:  No problem-specific Assessment & Plan notes found for this encounter.  No orders of the defined types were placed in this encounter.  This is a very pleasant 45 year old female patient with past medical history significant for beta thalassemia trait referred to hematology for evaluation of iron deficiency anemia and thalassemia minor.  Patient arrived to the appointment today by herself.  She complains of severe fatigue, lethargy, shortness of breath on exertion, palpitations, pica and attributes most of this to iron deficiency anemia.  She tells me that she has taken oral iron for short time, cannot tell if she felt different because she did not take it for long enough time. Physical examination unremarkable, no palpable lymphadenopathy or hepatosplenomegaly. I reviewed her labs which show ferritin of 16, hemoglobin of 10.6, iron saturation 9 total iron binding capacity consistent with iron deficiency.  Although she does have concomitant thalassemia minor, given no clear evidence of iron deficiency, recommended iron supplementation and reevaluate in a few weeks.  She requested if we can give her a small dose of IV iron so she can feel a little better because she has been feeling quite miserable. Again her symptoms appear to be a little out of proportion for the degree of iron deficiency but given no other medical comorbidities that can explain the symptoms, I think it is reasonable to give 1 dose of Venofer 200 mg IV and then continue oral ferrous sulfate 325 mg once a day and reevaluate with repeat labs in 4 weeks.  I have discussed the small risk of allergy and anaphylaxis with IV iron and common adverse effects include myalgias and flulike symptoms. She is agreeable to the above-mentioned  recommendations.  With regards to thalassemia minor, discussed this is a carrier condition and can result in mild anemia but does not necessarily result in change in life expectancy.  She understands that we tend to not go overboard with iron supplementation when there is concomitant thalassemia.  She will return to clinic in 4 weeks. Thank you for consulting [place] in the care of this patient.  Please not hesitate to contact us with any additional questions or concerns.  HISTORY OF PRESENTING ILLNESS:  Raven Phillips 45 y.o. female is here because of IDA and thalassemia minor.  This is a very pleasant 45 year old patient originally from [place] referred to hematology for evaluation of concomitant iron deficiency anemia and thalassemia minor.  Raven Phillips arrived to the appointment today by herself.  She complains of severe fatigue, shortness of breath, lethargy, cannot do most of the activities, sleeping about 16 hours a day for the past several months.  She tells me that some of her symptoms started about 6 years ago and have slowly progressed and have gotten worse since she had a recent hip replacement surgery about 3 years ago and she was thought to have lost some blood.  She was started on iron supplementation about 3 times a week but did not take it long enough to notice any change.  She most recently saw her doctor who suggested to see hematology given diagnosis of thalassemia minor and concerns over iron supplementation.  She also complains of some palpitations with exertion.  She has been craving some ice.  She does admit to heavy menstrual cycles which last about 4 days every month and  has recently seen her gynecologist to start birth control medication.  She tells me that birth control has not helped her in the past but she is hoping that this at least reduces the intensity of bleeding.She was hoping we can give her some iron to boost up her energy. She otherwise has a past medical history of hip  dysplasia and needed multiple surgeries.  She had transfusion about 15 years ago when she had a hip surgery.  She has 3 kids, 11,8 and 6.  Rest of the pertinent 10 point ROS reviewed and negative.   REVIEW OF SYSTEMS:   Constitutional: As mentioned above. Eyes: Denies blurriness of vision, double vision or watery eyes Ears, nose, mouth, throat, and face: Denies mucositis or sore throat Respiratory: Denies cough, dyspnea or wheezes Cardiovascular: Palpitations and some exertional shortness of breath.  No other complaints. Gastrointestinal:  Denies nausea, heartburn or change in bowel habits Skin: Denies abnormal skin rashes Lymphatics: Denies new lymphadenopathy or easy bruising Neurological:Denies numbness, tingling or new weaknesses Behavioral/Psych: Feels low because of low energy. All other systems were reviewed with the patient and are negative.  MEDICAL HISTORY:  Past Medical History:  Diagnosis Date  . Beta thalassemia trait   . Hx of varicella     SURGICAL HISTORY: Past Surgical History:  Procedure Laterality Date  . BARTHOLIN CYST MARSUPIALIZATION    . DILATION AND EVACUATION  01/21/2012   Procedure: DILATATION AND EVACUATION;  Surgeon: Robley Fries, MD;  Location: [facility];  Service: Gynecology;  Laterality: N/A;  chromosome studies  . FEMUR SURGERY     childhood  . HIP SURGERY  Childhood   For hip dysplasia  . KNEE SURGERY  childhhod    SOCIAL HISTORY: Social History   Socioeconomic History  . Marital status: Married    Spouse name: Not on file  . Number of children: Not on file  . Years of education: Not on file  . Highest education level: Not on file  Occupational History  . Not on file  Tobacco Use  . Smoking status: Never Smoker  . Smokeless tobacco: Never Used  Substance and Sexual Activity  . Alcohol use: Yes    Alcohol/week: 0.0 standard drinks    Comment: occas. wine  . Drug use: No  . Sexual activity: Yes  Other Topics Concern  . Not on file   Social History Narrative  . Not on file   Social Determinants of Health   Financial Resource Strain: Not on file  Food Insecurity: Not on file  Transportation Needs: Not on file  Physical Activity: Not on file  Stress: Not on file  Social Connections: Not on file  Intimate Partner Violence: Not on file    FAMILY HISTORY: Family History  Problem Relation Age of Onset  . Heart disease Mother   . Varicose Veins Mother   . Hypertension Mother   . Diabetes Father   . Anemia Father   . Stroke Father     ALLERGIES:  is allergic to seasonal ic [cholestatin].  MEDICATIONS:  Current Outpatient Medications  Medication Sig Dispense Refill  . cyclobenzaprine (FLEXERIL) 5 MG tablet Take 1 tablet (5 mg total) by mouth at bedtime. 30 tablet 0  . diazepam (VALIUM) 5 MG tablet Take 5 mg by mouth 2 (two) times daily as needed.    Marland Kitchen escitalopram (LEXAPRO) 20 MG tablet Take 1 tablet by mouth once.    Marland Kitchen ibuprofen (ADVIL) 600 MG tablet Take 600 mg by mouth  3 (three) times daily.    . LO LOESTRIN FE 1 MG-10 MCG / 10 MCG tablet Take 1 tablet by mouth daily.    . Prenatal Vit-Fe Fumarate-FA (PRENATAL MULTIVITAMIN) TABS Take 1 tablet by mouth daily.    . traMADol (ULTRAM) 50 MG tablet Take 50 mg by mouth every 6 (six) hours as needed.     No current facility-administered medications for this visit.     PHYSICAL EXAMINATION: ECOG PERFORMANCE STATUS: 0 - Asymptomatic  Vitals:   11/20/20 1000  BP: 135/89  Pulse: 79  Resp: 20  Temp: 100 F (37.8 C)   Filed Weights   11/20/20 1000  Weight: 132 lb (59.9 kg)    GENERAL:alert, no distress and comfortable SKIN: skin color, texture, turgor are normal, no rashes or significant lesions EYES: normal, conjunctiva are pink and non-injected, sclera clear OROPHARYNX:no exudate, no erythema and lips, buccal mucosa, and tongue normal  NECK: supple, thyroid normal size, non-tender, without nodularity LYMPH:  no palpable lymphadenopathy in the  cervical, axillary or inguinal LUNGS: clear to auscultation and percussion with normal breathing effort HEART: regular rate & rhythm and no murmurs and no lower extremity edema ABDOMEN:abdomen soft, non-tender and normal bowel sounds Musculoskeletal:no cyanosis of digits and no clubbing  PSYCH: alert & oriented x 3 with fluent speech NEURO: no focal motor/sensory deficits  LABORATORY DATA:  I have reviewed the data as listed Lab Results  Component Value Date   WBC 11.2 (H) 09/15/2014   HGB 8.6 (L) 09/15/2014   HCT 26.3 (L) 09/15/2014   MCV 66.2 (L) 09/15/2014   PLT 141 (L) 09/15/2014     Chemistry   No results found for: NA, K, CL, CO2, BUN, CREATININE, GLU No results found for: CALCIUM, ALKPHOS, AST, ALT, BILITOT     RADIOGRAPHIC STUDIES: I have personally reviewed the radiological images as listed and agreed with the findings in the report. No results found.  Reviewed pertinent labs.  Most recent hemoglobin of 10.6, ferritin of 16, vitamin D of 27.  MCV of 66.  Electrophoresis consistent with beta thalassemia minor. Iron panel and saturation consistent with iron deficiency.  I spent 45 minutes in the care of this patient including history and physical, review of old records, discussion about thalassemia minor, iron deficiency anemia, treatment planning and coordination of care.    Rachel Moulds, MD 11/20/2020 12:55 PM

## 2020-11-20 ENCOUNTER — Inpatient Hospital Stay: Payer: 59

## 2020-11-20 ENCOUNTER — Inpatient Hospital Stay: Payer: 59 | Attending: Hematology and Oncology | Admitting: Hematology and Oncology

## 2020-11-20 ENCOUNTER — Telehealth: Payer: Self-pay | Admitting: Hematology and Oncology

## 2020-11-20 ENCOUNTER — Encounter: Payer: Self-pay | Admitting: Hematology and Oncology

## 2020-11-20 ENCOUNTER — Other Ambulatory Visit: Payer: Self-pay

## 2020-11-20 DIAGNOSIS — R002 Palpitations: Secondary | ICD-10-CM | POA: Insufficient documentation

## 2020-11-20 DIAGNOSIS — R0602 Shortness of breath: Secondary | ICD-10-CM | POA: Insufficient documentation

## 2020-11-20 DIAGNOSIS — D563 Thalassemia minor: Secondary | ICD-10-CM | POA: Insufficient documentation

## 2020-11-20 DIAGNOSIS — D509 Iron deficiency anemia, unspecified: Secondary | ICD-10-CM | POA: Diagnosis present

## 2020-11-20 DIAGNOSIS — R5383 Other fatigue: Secondary | ICD-10-CM | POA: Insufficient documentation

## 2020-11-20 DIAGNOSIS — D500 Iron deficiency anemia secondary to blood loss (chronic): Secondary | ICD-10-CM

## 2020-11-20 NOTE — Telephone Encounter (Signed)
Scheduled appointments per 1/11 los. Spoke to patient who is aware of appointments dates and times.  

## 2020-11-21 ENCOUNTER — Telehealth: Payer: Self-pay | Admitting: Hematology and Oncology

## 2020-11-21 NOTE — Telephone Encounter (Signed)
Scheduled appt per 1/11 sch msg - pt is aware of appt scheduled for 1/18

## 2020-11-26 ENCOUNTER — Encounter: Payer: Self-pay | Admitting: Hematology and Oncology

## 2020-11-27 ENCOUNTER — Inpatient Hospital Stay: Payer: 59

## 2020-11-27 ENCOUNTER — Encounter: Payer: Self-pay | Admitting: Hematology and Oncology

## 2020-11-28 ENCOUNTER — Other Ambulatory Visit: Payer: Self-pay | Admitting: Hematology and Oncology

## 2020-11-30 ENCOUNTER — Telehealth: Payer: Self-pay | Admitting: Hematology and Oncology

## 2020-11-30 ENCOUNTER — Encounter: Payer: Self-pay | Admitting: Hematology and Oncology

## 2020-11-30 NOTE — Telephone Encounter (Signed)
R/s iron infusion from 1/18. Called patient, no answer. Left message for patient with updated appointment date and time. Rescheduled lab/MD visit on 2/8 as well pre provider PAL schedule. Also let patient know about updated day/time for those appointments. Left instructions for patient to call back to confirm infusion on Sunday.

## 2020-12-02 ENCOUNTER — Inpatient Hospital Stay: Payer: 59

## 2020-12-02 ENCOUNTER — Other Ambulatory Visit: Payer: Self-pay

## 2020-12-02 VITALS — BP 124/72 | HR 67 | Temp 98.7°F | Resp 17

## 2020-12-02 DIAGNOSIS — D500 Iron deficiency anemia secondary to blood loss (chronic): Secondary | ICD-10-CM

## 2020-12-02 DIAGNOSIS — D509 Iron deficiency anemia, unspecified: Secondary | ICD-10-CM | POA: Diagnosis not present

## 2020-12-02 MED ORDER — SODIUM CHLORIDE 0.9 % IV SOLN
Freq: Once | INTRAVENOUS | Status: AC
  Filled 2020-12-02: qty 250

## 2020-12-02 MED ORDER — FAMOTIDINE IN NACL 20-0.9 MG/50ML-% IV SOLN
20.0000 mg | Freq: Once | INTRAVENOUS | Status: AC | PRN
  Administered 2020-12-02: 20 mg via INTRAVENOUS

## 2020-12-02 MED ORDER — FAMOTIDINE IN NACL 20-0.9 MG/50ML-% IV SOLN
INTRAVENOUS | Status: AC
  Filled 2020-12-02: qty 50

## 2020-12-02 MED ORDER — METHYLPREDNISOLONE SODIUM SUCC 125 MG IJ SOLR
60.0000 mg | Freq: Once | INTRAMUSCULAR | Status: AC
  Administered 2020-12-02: 60 mg via INTRAVENOUS

## 2020-12-02 MED ORDER — SODIUM CHLORIDE 0.9 % IV SOLN
200.0000 mg | Freq: Once | INTRAVENOUS | Status: AC
  Administered 2020-12-02: 200 mg via INTRAVENOUS
  Filled 2020-12-02: qty 200

## 2020-12-02 NOTE — Patient Instructions (Signed)

## 2020-12-02 NOTE — Progress Notes (Signed)
At approximately 1002 patient c/o burning and itching in BLE.  Lower legs/ankles reddened.  Patient was about 20 minutes into her post-obs period.  RN notified Marga Hoots, PA.  Per Marga Hoots, give Pepcid 20mg  IV x 1.  NS started to gravity.  No c/o back pain, nausea, or difficulty breathing.   At 1025, redness increasing.  V.O. received from , PA to administer Solu-Medrol 60mg  IV x 1.  At 1045, V.O. received to administer another dose of Solu-Medrol 60mg  x 1.  Per Marga Hoots, after administration of Solu-Medrol patient can be discharged with instructions for patient to take Benadryl 25mg  po if itching or redness persists.  Verbalized understanding.  At discharge, redness improved.  VSS.  No other s/s or c/o distress or discomfort.  Patient ambulatory to exit.

## 2020-12-03 ENCOUNTER — Encounter: Payer: Self-pay | Admitting: Hematology and Oncology

## 2020-12-04 ENCOUNTER — Ambulatory Visit: Payer: 59 | Admitting: Sports Medicine

## 2020-12-18 ENCOUNTER — Ambulatory Visit: Payer: 59 | Admitting: Hematology and Oncology

## 2020-12-18 ENCOUNTER — Other Ambulatory Visit: Payer: 59

## 2020-12-19 ENCOUNTER — Other Ambulatory Visit: Payer: Self-pay

## 2020-12-19 ENCOUNTER — Encounter: Payer: Self-pay | Admitting: Hematology and Oncology

## 2020-12-19 ENCOUNTER — Inpatient Hospital Stay: Payer: 59 | Admitting: Hematology and Oncology

## 2020-12-19 ENCOUNTER — Other Ambulatory Visit: Payer: Self-pay | Admitting: Hematology and Oncology

## 2020-12-19 ENCOUNTER — Inpatient Hospital Stay: Payer: 59 | Attending: Hematology and Oncology

## 2020-12-19 VITALS — BP 111/83 | HR 73 | Temp 98.7°F | Resp 18 | Ht 64.0 in | Wt 129.9 lb

## 2020-12-19 DIAGNOSIS — N920 Excessive and frequent menstruation with regular cycle: Secondary | ICD-10-CM | POA: Diagnosis not present

## 2020-12-19 DIAGNOSIS — D563 Thalassemia minor: Secondary | ICD-10-CM | POA: Insufficient documentation

## 2020-12-19 DIAGNOSIS — D500 Iron deficiency anemia secondary to blood loss (chronic): Secondary | ICD-10-CM | POA: Diagnosis not present

## 2020-12-19 DIAGNOSIS — R42 Dizziness and giddiness: Secondary | ICD-10-CM | POA: Insufficient documentation

## 2020-12-19 DIAGNOSIS — R002 Palpitations: Secondary | ICD-10-CM | POA: Diagnosis not present

## 2020-12-19 DIAGNOSIS — R5383 Other fatigue: Secondary | ICD-10-CM | POA: Diagnosis not present

## 2020-12-19 DIAGNOSIS — D509 Iron deficiency anemia, unspecified: Secondary | ICD-10-CM | POA: Insufficient documentation

## 2020-12-19 LAB — CBC WITH DIFFERENTIAL/PLATELET
Abs Immature Granulocytes: 0.02 10*3/uL (ref 0.00–0.07)
Basophils Absolute: 0.1 10*3/uL (ref 0.0–0.1)
Basophils Relative: 1 %
Eosinophils Absolute: 0.2 10*3/uL (ref 0.0–0.5)
Eosinophils Relative: 2 %
HCT: 32.3 % — ABNORMAL LOW (ref 36.0–46.0)
Hemoglobin: 10.1 g/dL — ABNORMAL LOW (ref 12.0–15.0)
Immature Granulocytes: 0 %
Lymphocytes Relative: 43 %
Lymphs Abs: 3 10*3/uL (ref 0.7–4.0)
MCH: 21 pg — ABNORMAL LOW (ref 26.0–34.0)
MCHC: 31.3 g/dL (ref 30.0–36.0)
MCV: 67.2 fL — ABNORMAL LOW (ref 80.0–100.0)
Monocytes Absolute: 0.5 10*3/uL (ref 0.1–1.0)
Monocytes Relative: 8 %
Neutro Abs: 3.3 10*3/uL (ref 1.7–7.7)
Neutrophils Relative %: 46 %
Platelets: 331 10*3/uL (ref 150–400)
RBC: 4.81 MIL/uL (ref 3.87–5.11)
RDW: 15.7 % — ABNORMAL HIGH (ref 11.5–15.5)
WBC: 7 10*3/uL (ref 4.0–10.5)
nRBC: 0 % (ref 0.0–0.2)

## 2020-12-19 LAB — IRON AND TIBC
Iron: 128 ug/dL (ref 41–142)
Saturation Ratios: 37 % (ref 21–57)
TIBC: 343 ug/dL (ref 236–444)
UIBC: 214 ug/dL (ref 120–384)

## 2020-12-19 LAB — FERRITIN: Ferritin: 57 ng/mL (ref 11–307)

## 2020-12-19 NOTE — Progress Notes (Signed)
[facility] CONSULT NOTE  Patient Care Team: Pcp, No as PCP - General  CHIEF COMPLAINTS/PURPOSE OF CONSULTATION:  Iron deficiency anemia and thalassemia minor.  ASSESSMENT & PLAN:  No problem-specific Assessment & Plan notes found for this encounter.  No orders of the defined types were placed in this encounter.  This is a very pleasant 45 year old female patient with past medical history significant for beta thalassemia trait referred to hematology for evaluation of iron deficiency anemia and thalassemia minor.  Patient arrived to the appointment today by herself.  She complains of ongoing fatigue, palpitations and positional dizziness although this may have gotten a little better. She tolerated iron well. She continues to have menorrhagia, bled almost 14 days in this month. PE unremarkable today. Her labs from today are not consistent with severe IDA.  She may have mild iron deficiency and she can continue to take oral iron at this time. She will have to discuss with her Gyn about menorrhagia treatment since this is likely contributing to anemia. RTC in 6 weeks.  Thank you for consulting [place] in the care of this patient.  Please not hesitate to contact us with any additional questions or concerns.  HISTORY OF PRESENTING ILLNESS:  Raven Phillips 45 y.o. female is here because of IDA and thalassemia minor.  This is a very pleasant 45 year old patient originally from [place] referred to hematology for evaluation of concomitant iron deficiency anemia and thalassemia minor.   Since last time, she had a dose of venofer, some improvement in symptoms Ongoing fatigue, positional dizziness and palpitations. She continues to have long and irregular cycles, bled for almost 14 days out of 28 days. She will be talking to her gyn soon.  She otherwise has a past medical history of hip dysplasia and needed multiple surgeries.  She had transfusion about 15 years ago when she had a hip  surgery.  She has 3 kids, 11,8 and 6.  Rest of the pertinent 10 point ROS reviewed and negative.   REVIEW OF SYSTEMS:   Constitutional: As mentioned above. Eyes: Denies blurriness of vision, double vision or watery eyes Ears, nose, mouth, throat, and face: Denies mucositis or sore throat Respiratory: Denies cough, dyspnea or wheezes Cardiovascular: Palpitations and some exertional shortness of breath.  No other complaints. Gastrointestinal:  Denies nausea, heartburn or change in bowel habits Skin: Denies abnormal skin rashes Lymphatics: Denies new lymphadenopathy or easy bruising Neurological:Denies numbness, tingling or new weaknesses Behavioral/Psych: Feels low because of low energy. All other systems were reviewed with the patient and are negative.  MEDICAL HISTORY:  Past Medical History:  Diagnosis Date  . Beta thalassemia trait   . Hx of varicella     SURGICAL HISTORY: Past Surgical History:  Procedure Laterality Date  . BARTHOLIN CYST MARSUPIALIZATION    . DILATION AND EVACUATION  01/21/2012   Procedure: DILATATION AND EVACUATION;  Surgeon: Robley Fries, MD;  Location: [facility];  Service: Gynecology;  Laterality: N/A;  chromosome studies  . FEMUR SURGERY     childhood  . HIP SURGERY  Childhood   For hip dysplasia  . KNEE SURGERY  childhhod    SOCIAL HISTORY: Social History   Socioeconomic History  . Marital status: Married    Spouse name: Not on file  . Number of children: Not on file  . Years of education: Not on file  . Highest education level: Not on file  Occupational History  . Not on file  Tobacco Use  .  Smoking status: Never Smoker  . Smokeless tobacco: Never Used  Substance and Sexual Activity  . Alcohol use: Yes    Alcohol/week: 0.0 standard drinks    Comment: occas. wine  . Drug use: No  . Sexual activity: Yes  Other Topics Concern  . Not on file  Social History Narrative  . Not on file   Social Determinants of Health   Financial Resource  Strain: Not on file  Food Insecurity: Not on file  Transportation Needs: Not on file  Physical Activity: Not on file  Stress: Not on file  Social Connections: Not on file  Intimate Partner Violence: Not on file    FAMILY HISTORY: Family History  Problem Relation Age of Onset  . Heart disease Mother   . Varicose Veins Mother   . Hypertension Mother   . Diabetes Father   . Anemia Father   . Stroke Father     ALLERGIES:  is allergic to iron sucrose and seasonal ic [cholestatin].  MEDICATIONS:  Current Outpatient Medications  Medication Sig Dispense Refill  . diazepam (VALIUM) 5 MG tablet Take 5 mg by mouth 2 (two) times daily as needed.    Marland Kitchen escitalopram (LEXAPRO) 20 MG tablet Take 1 tablet by mouth once.    Marland Kitchen ibuprofen (ADVIL) 600 MG tablet Take 600 mg by mouth 3 (three) times daily.    . LO LOESTRIN FE 1 MG-10 MCG / 10 MCG tablet Take 1 tablet by mouth daily.    . traMADol (ULTRAM) 50 MG tablet Take 50 mg by mouth every 6 (six) hours as needed.    . cyclobenzaprine (FLEXERIL) 5 MG tablet Take 1 tablet (5 mg total) by mouth at bedtime. 30 tablet 0  . Prenatal Vit-Fe Fumarate-FA (PRENATAL MULTIVITAMIN) TABS Take 1 tablet by mouth daily.     No current facility-administered medications for this visit.     PHYSICAL EXAMINATION: ECOG PERFORMANCE STATUS: 0 - Asymptomatic  Vitals:   12/19/20 1313  BP: 111/83  Pulse: 73  Resp: 18  Temp: 98.7 F (37.1 C)  SpO2: 100%   Filed Weights   12/19/20 1313  Weight: 129 lb 14.4 oz (58.9 kg)    GENERAL:alert, no distress and comfortable SKIN: skin color, texture, turgor are normal, no rashes or significant lesions EYES: normal, conjunctiva are pink and non-injected, sclera clear OROPHARYNX:no exudate, no erythema and lips, buccal mucosa, and tongue normal  NECK: supple, thyroid normal size, non-tender, without nodularity LYMPH:  no palpable lymphadenopathy in the cervical, axillary or inguinal LUNGS: clear to auscultation and  percussion with normal breathing effort HEART: regular rate & rhythm and no murmurs and no lower extremity edema ABDOMEN:abdomen soft, non-tender and normal bowel sounds Musculoskeletal:no cyanosis of digits and no clubbing  PSYCH: alert & oriented x 3 with fluent speech NEURO: no focal motor/sensory deficits  LABORATORY DATA:  I have reviewed the data as listed Lab Results  Component Value Date   WBC 7.0 12/19/2020   HGB 10.1 (L) 12/19/2020   HCT 32.3 (L) 12/19/2020   MCV 67.2 (L) 12/19/2020   PLT 331 12/19/2020     Chemistry   No results found for: NA, K, CL, CO2, BUN, CREATININE, GLU No results found for: CALCIUM, ALKPHOS, AST, ALT, BILITOT     RADIOGRAPHIC STUDIES: I have personally reviewed the radiological images as listed and agreed with the findings in the report. No results found.  Reviewed pertinent labs.  Most recent hemoglobin of 10.1 Iron panel and saturation with ferritin of  57  I spent 20 minutes in the care of this patient including history and physical, review of old records, discussion about thalassemia minor, iron deficiency anemia, treatment planning and coordination of care.    Rachel Moulds, MD 12/19/2020 1:23 PM

## 2020-12-27 ENCOUNTER — Ambulatory Visit: Payer: 59 | Admitting: Sports Medicine

## 2021-01-15 ENCOUNTER — Ambulatory Visit: Payer: 59 | Admitting: Sports Medicine

## 2021-01-18 ENCOUNTER — Encounter: Payer: Self-pay | Admitting: Family Medicine

## 2021-01-18 ENCOUNTER — Ambulatory Visit: Payer: 59 | Admitting: Family Medicine

## 2021-01-18 ENCOUNTER — Other Ambulatory Visit: Payer: Self-pay

## 2021-01-18 VITALS — BP 118/82 | Ht 63.0 in | Wt 130.0 lb

## 2021-01-18 DIAGNOSIS — M25552 Pain in left hip: Secondary | ICD-10-CM | POA: Diagnosis not present

## 2021-01-18 DIAGNOSIS — M2170 Unequal limb length (acquired), unspecified site: Secondary | ICD-10-CM

## 2021-01-18 DIAGNOSIS — Z96642 Presence of left artificial hip joint: Secondary | ICD-10-CM | POA: Diagnosis not present

## 2021-01-18 DIAGNOSIS — M545 Low back pain, unspecified: Secondary | ICD-10-CM | POA: Diagnosis not present

## 2021-01-18 DIAGNOSIS — G8929 Other chronic pain: Secondary | ICD-10-CM

## 2021-01-18 MED ORDER — MELOXICAM 15 MG PO TABS: 15.0000 mg | ORAL_TABLET | Freq: Every day | ORAL | 2 refills | Status: DC

## 2021-01-18 NOTE — Progress Notes (Addendum)
PCP: Pcp, No  Subjective:   HPI: Patient is a 45 y.o. female with complex medical history including left hip dysplasia at birth requiring hip replacement in 2019, acquired leg length discrepancy status post attempted correction, femoral anteversion with subsequent chronic low back and hip pain who is here for follow-up from visit on 11/01/2020.  At last visit, patient was found to have a persistent right longer than left leg length discrepancy, pes planus, femoral anteversion and was started on a HEP for glutes strengthening and pelvic stabilizer strengthening, along with green sports insoles with scaphoid padding on the left and a heel lift on the right.  Since that visit, patient reports that she has had significant improvement in her low back pain, however she has had worsening of left hip pain.  She reports that while walking, she feels like the scaphoid pad may be too aggressive and is pushing her foot out.  Her hip pain is mostly over the lateral aspect of her hip and is worse with walking.  She has been diligent about her hip exercises and feels like these are slowly helping, she is wondering if her hip pain may just be due to the strengthening process.  She has not had any new trauma, falls or injuries since her last visit.   Review of Systems:  Per HPI.   PMFSH, medications and smoking status reviewed.      Objective:  Physical Exam:  No flowsheet data found.   Gen: awake, alert, NAD, comfortable in exam room Pulm: breathing unlabored  Lumbar spine/hips:  -Inspection: Mild dextroscoliosis with flexion, normal lumbar lordosis and no signs of kyphosis. When standing, patient's right ASIS is approximately 2 cm lower than her left.  Some mild right quadriceps, hamstring and gluteal muscular atrophy compared to contralateral side.   Approximate 1 cm leg length discrepancy (left longer than right) -Gait evaluation: Patient has a Trendelenburg sway to the right, she walks with femoral  anteversion bilaterally.  Significant longitudinal arch collapse bilaterally.  Mild collapse of transverse arch bilaterally as well.  Evaluation of gait with green insoles in place shows correction of longitudinal arch collapse and near neutral positioning of the foot. -ROM: Approximately equal bilaterally internal rotation of the left hip with significant decrease in left hip external rotation compared to right.  Full range of motion of the lumbar spine with minimal pain. -Strength: Weak hip abduction bilaterally, more severe on the left. -Sensation: Intact in bilateral lower extremities -Special tests: Mild pain with FADIR of the left, mild pain with FABER over the SI joint of the left.  Negative straight leg raise bilaterally   Imaging: Previous MRI results from outside providers reviewed.  Significant for moderate degenerative arthropathy at L4-L5, as well as severe arthropathy at L5-S1.   Assessment & Plan:  1.  Low back and left hip pain Patient with significant improvement in her low back pain, however some worsening of her left lateral hip pain.  Given this and her discomfort with the scaphoid pad, I wonder if she may have some excessive arch support over the left foot, causing some supination and excess strain over the lateral hip stabilizers/glutes.  With that in mind, I remove the scaphoid pad and she did report some improvement in comfort.  She continues to have weak hip abduction strength of the left, however subjectively patient reports that is improved after doing her exercises.  Plan: -Continue green insoles with heel lift on the right, scaphoid pad removed from the left -  Patient will keep current follow-up with Dr. Darrick Penna at the end of the month, plan to fashioned custom orthotics at that time -I will send in meloxicam to be used as needed for pain in the short-term -Continue HEP from previous visit   Guy Sandifer, MD [facility] Fellow 01/18/2021 11:59  AM  Addendum:  I was the preceptor for this visit and available for immediate consultation.  Norton Blizzard MD Marrianne Mood

## 2021-01-22 ENCOUNTER — Other Ambulatory Visit: Payer: Self-pay

## 2021-01-22 MED ORDER — METHOCARBAMOL 500 MG PO TABS: 500.0000 mg | ORAL_TABLET | Freq: Every evening | ORAL | 0 refills | Status: DC | PRN

## 2021-01-30 ENCOUNTER — Other Ambulatory Visit: Payer: 59

## 2021-01-30 ENCOUNTER — Ambulatory Visit: Payer: 59 | Admitting: Hematology and Oncology

## 2021-02-07 ENCOUNTER — Ambulatory Visit: Payer: 59 | Admitting: Sports Medicine

## 2021-02-07 ENCOUNTER — Other Ambulatory Visit: Payer: Self-pay

## 2021-02-07 VITALS — BP 122/84 | Ht 63.5 in | Wt 130.0 lb

## 2021-02-07 DIAGNOSIS — M2170 Unequal limb length (acquired), unspecified site: Secondary | ICD-10-CM

## 2021-02-07 DIAGNOSIS — M2142 Flat foot [pes planus] (acquired), left foot: Secondary | ICD-10-CM | POA: Diagnosis not present

## 2021-02-07 DIAGNOSIS — M2141 Flat foot [pes planus] (acquired), right foot: Secondary | ICD-10-CM | POA: Diagnosis not present

## 2021-02-07 NOTE — Progress Notes (Addendum)
Office Visit Note   Patient: Raven Phillips           Date of Birth: 1976/06/04           MRN: [id number] Visit Date: 02/07/2021 Requested by: No referring provider defined for this encounter. PCP: Pcp, No  Subjective: CC: Custom orthotics  HPI: 45 year old female, with complex musculoskeletal health history including left hip replacement, multiple leg length adjustment surgeries and revisions, who is presenting to clinic for custom orthotic fitting.  At previous appointment, patient was placed in green sports insoles with heel lift, which she states offered significant improvement to her back pain.  Unfortunately, patient started to get pain throughout her left hip and gluteal region, though this has improved somewhat with physical therapy.  Given her improvement with orthotics thus far, patient is hoping custom orthotics might offer further benefit.  She continues to work with physical therapy, stating that she has been doing clamshells and other "basic" hip exercises, though she denies particular abduction activities.  Overall, patient is happy with the progress she is made since her initial appointment in clinic.              ROS:   All other systems were reviewed and are negative.  Objective: Vital Signs: BP 122/84   Ht 5' 3.5" (1.613 m)   Wt 130 lb (59 kg)   BMI 22.67 kg/m  No flowsheet data found.   No flowsheet data found.  Physical Exam:  General:  Alert and oriented, in no acute distress. Pulm:  Breathing unlabored. Psy:  Normal mood, congruent affect. Skin: Bilateral feet without bruises, rashes, or erythema. Overlying skin intact.   MSK:  When standing, patient's right ASIS is approximately 2 cm lower than her left.  Gait evaluation: Though she continues to display a Trendelenburg sway to the right, this is much improved from previous evaluation. She continues to walk with femoral anteversion.   Significant pes planus bilaterally upon standing.  Patient's right  quad, hamstrings, and gluteal musculature well developed  on the left all appear less defined when compared to the right. When supine, right ASIS appears approximately 2 cm distal to left.  Malleoli well approximated.  Right leg measures approximately 84.5 cm, compared to 86 cm on the left.   Left hip range of motion markedly reduced with internal rotation.  External rotation also reduced when compared to right. FABER is limited  Week gluteal abduction, internal and Ext rotation on the left when compared to the right.   Imaging: Previous MRI results from outside providers reviewed.  Significant for moderate degenerative arthropathy at L4-L5, as well as severe arthropathy at L5-S1.  Assessment & Plan: 45 year old female with past medical history significant for left hip replacement, leg lengthening and shortening surgeries and revisions, who is presenting to clinic for custom orthotic fitting. -Custom orthotics made as described below, which patient stated were comfortable upon fitting. -Discussed continued hip exercises, with a focus in abduction as well as internal and external rotation of the hip. -Return to clinic in 2 to 3 months for reevaluation. -Patient expressed understanding with plan.  She had no further questions or concerns today.     Procedures: Patient was fitted for a : standard, cushioned, semi-rigid orthotic. The orthotic was heated and afterward the patient stood on the orthotic blank positioned on the orthotic stand. The patient was positioned in subtalar neutral position and 10 degrees of ankle dorsiflexion in a weight bearing stance. After completion of molding, a stable  base was applied to the orthotic blank. The blank was ground to a stable position for weight bearing. Size: 7 men's Base: EVA Posting: None Additional orthotic padding: 3/8" Heel lift on right  I observed and examined the patient with the SM resident and agree with assessment and plan.  Note  reviewed and modified by me. Sterling Big, MD

## 2021-02-28 ENCOUNTER — Other Ambulatory Visit: Payer: Self-pay | Admitting: Sports Medicine

## 2021-03-19 ENCOUNTER — Telehealth: Payer: Self-pay | Admitting: Hematology and Oncology

## 2021-03-19 ENCOUNTER — Other Ambulatory Visit: Payer: 59

## 2021-03-19 ENCOUNTER — Ambulatory Visit: Payer: 59 | Admitting: Hematology and Oncology

## 2021-03-19 NOTE — Telephone Encounter (Signed)
Called pt to r/s appts per 5/10 sch msg. No answer and mailbox was full so unable to leave a msg. Will try to call pt again tomorrow.

## 2021-03-19 NOTE — Progress Notes (Deleted)
[facility] CONSULT NOTE  Patient Care Team: Pcp, No as PCP - General  CHIEF COMPLAINTS/PURPOSE OF CONSULTATION:  Iron deficiency anemia and thalassemia minor.  ASSESSMENT & PLAN:   No problem-specific Assessment & Plan notes found for this encounter.  No orders of the defined types were placed in this encounter.  This is a very pleasant 45 year old female patient with past medical history significant for beta thalassemia trait referred to hematology for evaluation of iron deficiency anemia and thalassemia minor.    RTC in 6 weeks.  HISTORY OF PRESENTING ILLNESS:   Raven Phillips 45 y.o. female is here because of IDA and thalassemia minor.  This is a very pleasant 45 year old patient originally from [place] referred to hematology for evaluation of concomitant iron deficiency anemia and thalassemia minor.    Since last time, she had a dose of venofer, some improvement in symptoms Ongoing fatigue, positional dizziness and palpitations. She continues to have long and irregular cycles, bled for almost 14 days out of 28 days. She will be talking to her gyn soon.  She otherwise has a past medical history of hip dysplasia and needed multiple surgeries.  She had transfusion about 15 years ago when she had a hip surgery.  She has 3 kids, 11,8 and 6.  Rest of the pertinent 10 point ROS reviewed and negative.  REVIEW OF SYSTEMS:   Constitutional: As mentioned above. Eyes: Denies blurriness of vision, double vision or watery eyes Ears, nose, mouth, throat, and face: Denies mucositis or sore throat Respiratory: Denies cough, dyspnea or wheezes Cardiovascular: Palpitations and some exertional shortness of breath.  No other complaints. Gastrointestinal:  Denies nausea, heartburn or change in bowel habits Skin: Denies abnormal skin rashes Lymphatics: Denies new lymphadenopathy or easy bruising Neurological:Denies numbness, tingling or new weaknesses Behavioral/Psych: Feels  low because of low energy. All other systems were reviewed with the patient and are negative.  MEDICAL HISTORY:  Past Medical History:  Diagnosis Date  . Beta thalassemia trait   . Hx of varicella     SURGICAL HISTORY: Past Surgical History:  Procedure Laterality Date  . BARTHOLIN CYST MARSUPIALIZATION    . DILATION AND EVACUATION  01/21/2012   Procedure: DILATATION AND EVACUATION;  Surgeon: Robley Fries, MD;  Location: [facility];  Service: Gynecology;  Laterality: N/A;  chromosome studies  . FEMUR SURGERY     childhood  . HIP SURGERY  Childhood   For hip dysplasia  . KNEE SURGERY  childhhod    SOCIAL HISTORY: Social History   Socioeconomic History  . Marital status: Married    Spouse name: Not on file  . Number of children: Not on file  . Years of education: Not on file  . Highest education level: Not on file  Occupational History  . Not on file  Tobacco Use  . Smoking status: Never Smoker  . Smokeless tobacco: Never Used  Substance and Sexual Activity  . Alcohol use: Yes    Alcohol/week: 0.0 standard drinks    Comment: occas. wine  . Drug use: No  . Sexual activity: Yes  Other Topics Concern  . Not on file  Social History Narrative  . Not on file   Social Determinants of Health   Financial Resource Strain: Not on file  Food Insecurity: Not on file  Transportation Needs: Not on file  Physical Activity: Not on file  Stress: Not on file  Social Connections: Not on file  Intimate Partner Violence: Not on file  FAMILY HISTORY: Family History  Problem Relation Age of Onset  . Heart disease Mother   . Varicose Veins Mother   . Hypertension Mother   . Diabetes Father   . Anemia Father   . Stroke Father     ALLERGIES:  is allergic to iron sucrose and seasonal ic [cholestatin].  MEDICATIONS:  Current Outpatient Medications  Medication Sig Dispense Refill  . diazepam (VALIUM) 5 MG tablet Take 5 mg by mouth 2 (two) times daily as needed.    Marland Kitchen  escitalopram (LEXAPRO) 20 MG tablet Take 1 tablet by mouth once.    Marland Kitchen ibuprofen (ADVIL) 600 MG tablet Take 600 mg by mouth 3 (three) times daily.    . LO LOESTRIN FE 1 MG-10 MCG / 10 MCG tablet Take 1 tablet by mouth daily.    . meloxicam (MOBIC) 15 MG tablet Take 1 tablet (15 mg total) by mouth daily. 30 tablet 2  . methocarbamol (ROBAXIN) 500 MG tablet TAKE 1 TABLET (500 MG TOTAL) BY MOUTH AT BEDTIME AS NEEDED FOR MUSCLE SPASMS. 30 tablet 0  . traMADol (ULTRAM) 50 MG tablet Take 50 mg by mouth every 6 (six) hours as needed.     No current facility-administered medications for this visit.     PHYSICAL EXAMINATION: ECOG PERFORMANCE STATUS: 0 - Asymptomatic  There were no vitals filed for this visit. There were no vitals filed for this visit.  GENERAL:alert, no distress and comfortable SKIN: skin color, texture, turgor are normal, no rashes or significant lesions EYES: normal, conjunctiva are pink and non-injected, sclera clear OROPHARYNX:no exudate, no erythema and lips, buccal mucosa, and tongue normal  NECK: supple, thyroid normal size, non-tender, without nodularity LYMPH:  no palpable lymphadenopathy in the cervical, axillary or inguinal LUNGS: clear to auscultation and percussion with normal breathing effort HEART: regular rate & rhythm and no murmurs and no lower extremity edema ABDOMEN:abdomen soft, non-tender and normal bowel sounds Musculoskeletal:no cyanosis of digits and no clubbing  PSYCH: alert & oriented x 3 with fluent speech NEURO: no focal motor/sensory deficits  LABORATORY DATA:  I have reviewed the data as listed Lab Results  Component Value Date   WBC 7.0 12/19/2020   HGB 10.1 (L) 12/19/2020   HCT 32.3 (L) 12/19/2020   MCV 67.2 (L) 12/19/2020   PLT 331 12/19/2020     Chemistry   No results found for: NA, K, CL, CO2, BUN, CREATININE, GLU No results found for: CALCIUM, ALKPHOS, AST, ALT, BILITOT    RADIOGRAPHIC STUDIES: I have personally reviewed the  radiological images as listed and agreed with the findings in the report.  No results found.    I spent 20 minutes in the care of this patient including history and physical, review of old records, discussion about thalassemia minor, iron deficiency anemia, treatment planning and coordination of care.    Rachel Moulds, MD 03/19/2021 1:46 PM

## 2021-03-20 ENCOUNTER — Telehealth: Payer: Self-pay | Admitting: Hematology and Oncology

## 2021-03-20 NOTE — Telephone Encounter (Signed)
Called pt to r/s appts per 5/10 sch msg. No answer. Left msg for pt to call back to r/s.  

## 2021-03-28 ENCOUNTER — Other Ambulatory Visit: Payer: BC Managed Care – PPO

## 2021-04-21 ENCOUNTER — Other Ambulatory Visit: Payer: Self-pay | Admitting: Sports Medicine

## 2021-06-23 ENCOUNTER — Emergency Department (HOSPITAL_BASED_OUTPATIENT_CLINIC_OR_DEPARTMENT_OTHER)
Admission: EM | Admit: 2021-06-23 | Discharge: 2021-06-23 | Disposition: A | Discharge status: Home / Self Care | Payer: 59 | Attending: Emergency Medicine | Admitting: Emergency Medicine

## 2021-06-23 ENCOUNTER — Other Ambulatory Visit: Payer: Self-pay

## 2021-06-23 ENCOUNTER — Encounter (HOSPITAL_BASED_OUTPATIENT_CLINIC_OR_DEPARTMENT_OTHER): Payer: Self-pay

## 2021-06-23 DIAGNOSIS — J029 Acute pharyngitis, unspecified: Secondary | ICD-10-CM

## 2021-06-23 DIAGNOSIS — U071 COVID-19: Secondary | ICD-10-CM | POA: Diagnosis not present

## 2021-06-23 DIAGNOSIS — E876 Hypokalemia: Secondary | ICD-10-CM | POA: Insufficient documentation

## 2021-06-23 DIAGNOSIS — M7918 Myalgia, other site: Secondary | ICD-10-CM | POA: Diagnosis present

## 2021-06-23 LAB — BASIC METABOLIC PANEL
Anion gap: 11 (ref 5–15)
BUN: <5 mg/dL — ABNORMAL LOW (ref 6–20)
CO2: 25 mmol/L (ref 22–32)
Calcium: 9.1 mg/dL (ref 8.9–10.3)
Chloride: 102 mmol/L (ref 98–111)
Creatinine, Ser: 0.6 mg/dL (ref 0.44–1.00)
GFR, Estimated: >60 mL/min (ref >60)
Glucose, Bld: 95 mg/dL (ref 70–99)
Potassium: 3.1 mmol/L — ABNORMAL LOW (ref 3.5–5.1)
Sodium: 138 mmol/L (ref 135–145)

## 2021-06-23 LAB — GROUP A STREP BY PCR: Group A Strep by PCR: NOT DETECTED

## 2021-06-23 MED ORDER — POTASSIUM CHLORIDE 10 MEQ/100ML IV SOLN
10.0000 meq | Freq: Once | INTRAVENOUS | Status: AC
  Administered 2021-06-23: 10 meq via INTRAVENOUS
  Filled 2021-06-23: qty 100

## 2021-06-23 MED ORDER — SODIUM CHLORIDE 0.9 % IV BOLUS
1000.0000 mL | Freq: Once | INTRAVENOUS | Status: AC
  Administered 2021-06-23: 1000 mL via INTRAVENOUS

## 2021-06-23 MED ORDER — PREDNISOLONE 15 MG/5ML PO SOLN: 45.0000 mg | Freq: Every day | ORAL | 0 refills | Status: AC

## 2021-06-23 MED ORDER — KETOROLAC TROMETHAMINE 15 MG/ML IJ SOLN
15.0000 mg | Freq: Once | INTRAMUSCULAR | Status: AC
  Administered 2021-06-23: 15 mg via INTRAVENOUS
  Filled 2021-06-23: qty 1

## 2021-06-23 MED ORDER — POTASSIUM CHLORIDE CRYS ER 20 MEQ PO TBCR
40.0000 meq | EXTENDED_RELEASE_TABLET | Freq: Once | ORAL | Status: AC
  Administered 2021-06-23: 40 meq via ORAL
  Filled 2021-06-23: qty 2

## 2021-06-23 NOTE — Discharge Instructions (Addendum)
Follow-up with local physician for worsening symptoms. Your strep test was negative. Your potassium was mildly low, this will improve once you are drinking and eating more over the next week.  Take prednisolone to help with swelling. Return to the emergency room for difficulty breathing or new concerns. Use Tylenol every 4 hours and Motrin/ibuprofen every 6 hours as needed for fever and pain. Gradually increase fluid and food intake as tolerated.

## 2021-06-23 NOTE — ED Triage Notes (Signed)
She states she has had mild body aches and sore throat since this Wed. She is in no distress.

## 2021-06-23 NOTE — ED Provider Notes (Signed)
[facility] EMERGENCY DEPT Provider Note   CSN: [id number] Arrival date & time: 06/23/21  1007     History Chief Complaint  Patient presents with   Sore throat/COVID +    Raven Phillips is a 45 y.o. female.  Patient presents with history of lumbar disc degeneration and has had body aches and sore throat since Wednesday.  Patient had positive COVID test.  Patient denies any significant respiratory symptoms or shortness of breath.  No current fevers.  Patient's primary concern is pain with swallowing and not tolerating very much fluid the past 2 to 3 days.  No significant sick contacts.      Past Medical History:  Diagnosis Date   Beta thalassemia trait    Hx of varicella     Patient Active Problem List   Diagnosis Date Noted   IDA (iron deficiency anemia) 11/20/2020   Acquired unequal leg length 11/01/2020   Abnormality of gait 11/01/2020   Lumbar spondylosis 10/26/2019   Degeneration of lumbar intervertebral disc 12/16/2018   Osteoarthritis of left hip joint due to dysplasia 05/06/2018   Postpartum care following vaginal delivery (11/5) 09/15/2014   Hip pain, chronic 09/14/2014   [redacted] weeks gestation of pregnancy 09/14/2014   Indication for care in labor or delivery 09/14/2014   SVD (spontaneous vaginal delivery) 09/14/2014   Acute blood loss anemia - mild 11/23/2012   Missed abortion 01/21/2012    Past Surgical History:  Procedure Laterality Date   BARTHOLIN CYST MARSUPIALIZATION     DILATION AND EVACUATION  01/21/2012   Procedure: DILATATION AND EVACUATION;  Surgeon: Robley Fries, MD;  Location: [facility];  Service: Gynecology;  Laterality: N/A;  chromosome studies   FEMUR SURGERY     childhood   HIP SURGERY  Childhood   For hip dysplasia   KNEE SURGERY  childhhod     OB History   This patient's OB History needs to be verified. Open OB History to review and resolve any issues.  Gravida  5   Para  3   Term  3   Preterm  0   AB  2    Living  3      SAB  2   IAB  0   Ectopic  0   Multiple  1   Live Births  3           Family History  Problem Relation Age of Onset   Heart disease Mother    Varicose Veins Mother    Hypertension Mother    Diabetes Father    Anemia Father    Stroke Father     Social History   Tobacco Use   Smoking status: Never   Smokeless tobacco: Never  Substance Use Topics   Alcohol use: Yes    Alcohol/week: 0.0 standard drinks    Comment: occas. wine   Drug use: No    Home Medications Prior to Admission medications   Medication Sig Start Date End Date Taking? Authorizing Provider  prednisoLONE (PRELONE) 15 MG/5ML SOLN Take 15 mLs (45 mg total) by mouth daily before breakfast for 4 days. 06/23/21 06/27/21 Yes Blane Ohara, MD  diazepam (VALIUM) 5 MG tablet Take 5 mg by mouth 2 (two) times daily as needed. 10/30/20   [provider]  escitalopram (LEXAPRO) 20 MG tablet Take 1 tablet by mouth once.    [provider]  ibuprofen (ADVIL) 600 MG tablet Take 600 mg by mouth 3 (three) times daily. 10/30/20  [provider]  LO LOESTRIN FE 1 MG-10 MCG / 10 MCG tablet Take 1 tablet by mouth daily. 10/22/20   [provider]  meloxicam (MOBIC) 15 MG tablet Take 1 tablet (15 mg total) by mouth daily. 01/18/21   Guy Sandifer, MD  methocarbamol (ROBAXIN) 500 MG tablet TAKE 1 TABLET BY MOUTH AT BEDTIME AS NEEDED FOR MUSCLE SPASMS 04/22/21   Enid Baas, MD  traMADol (ULTRAM) 50 MG tablet Take 50 mg by mouth every 6 (six) hours as needed. 10/30/20   [provider]    Allergies    Iron sucrose and Seasonal ic [cholestatin]  Review of Systems   Review of Systems  Constitutional:  Negative for chills and fever.  HENT:  Positive for congestion and sore throat.   Eyes:  Negative for visual disturbance.  Respiratory:  Negative for shortness of breath.   Cardiovascular:  Negative for chest pain.  Gastrointestinal:  Negative for abdominal  pain and vomiting.  Genitourinary:  Negative for dysuria and flank pain.  Musculoskeletal:  Negative for back pain, neck pain and neck stiffness.  Skin:  Negative for rash.  Neurological:  Positive for light-headedness. Negative for headaches.   Physical Exam Updated Vital Signs BP 133/83   Pulse 68   Temp 98.9 F (37.2 C) (Oral)   Resp 16   LMP 06/10/2021 (Exact Date)   SpO2 99%   Physical Exam Vitals and nursing note reviewed.  Constitutional:      General: She is not in acute distress.    Appearance: She is well-developed.  HENT:     Head: Normocephalic and atraumatic.     Comments: No abscess    Nose: Congestion present.     Mouth/Throat:     Mouth: Mucous membranes are dry.     Pharynx: Posterior oropharyngeal erythema present. No oropharyngeal exudate.  Eyes:     General:        Right eye: No discharge.        Left eye: No discharge.     Conjunctiva/sclera: Conjunctivae normal.  Neck:     Trachea: No tracheal deviation.  Cardiovascular:     Rate and Rhythm: Normal rate and regular rhythm.     Heart sounds: No murmur heard. Pulmonary:     Effort: Pulmonary effort is normal.     Breath sounds: Normal breath sounds.  Abdominal:     General: There is no distension.     Palpations: Abdomen is soft.     Tenderness: There is no abdominal tenderness. There is no guarding.  Musculoskeletal:        General: No swelling.     Cervical back: Normal range of motion and neck supple. No rigidity.  Skin:    General: Skin is warm.     Capillary Refill: Capillary refill takes less than 2 seconds.     Findings: No rash.  Neurological:     General: No focal deficit present.     Mental Status: She is alert.     Cranial Nerves: No cranial nerve deficit.  Psychiatric:        Mood and Affect: Mood normal.    ED Results / Procedures / Treatments   Labs (all labs ordered are listed, but only abnormal results are displayed) Labs Reviewed  BASIC METABOLIC PANEL - Abnormal;  Notable for the following components:      Result Value   Potassium 3.1 (*)    BUN <5 (*)    All other components within  normal limits  GROUP A STREP BY PCR    EKG None  Radiology No results found.  Procedures Procedures   Medications Ordered in ED Medications  potassium chloride 10 mEq in 100 mL IVPB (10 mEq Intravenous New Bag/Given 06/23/21 1236)  sodium chloride 0.9 % bolus 1,000 mL (1,000 mLs Intravenous New Bag/Given 06/23/21 1134)  ketorolac (TORADOL) 15 MG/ML injection 15 mg (15 mg Intravenous Given 06/23/21 1138)  potassium chloride SA (KLOR-CON) CR tablet 40 mEq (40 mEq Oral Given 06/23/21 1238)    ED Course  I have reviewed the triage vital signs and the nursing notes.  Pertinent labs & imaging results that were available during my care of the patient were reviewed by me and considered in my medical decision making (see chart for details).    MDM Rules/Calculators/A&P                           Patient with known COVID presents with persistent and worsening sore throat and concern for dehydration clinically.  Fortunately patient's vital signs unremarkable, no signs of abscess or meningitis.  Plan for supportive care, IV fluids, blood work and strep testing.  Toradol for pain. Blood work overall reassuring normal kidney function, mild low potassium 3.2.  IV and oral potassium given.  IV fluid bolus given.  Toradol for pain was given. Plan for prednisolone for 4 days, oral fluids and close outpatient follow-up.  Strep test negative reviewed.  Final Clinical Impression(s) / ED Diagnoses Final diagnoses:  COVID-19  Acute pharyngitis, unspecified etiology  Hypokalemia    Rx / DC Orders ED Discharge Orders          Ordered    prednisoLONE (PRELONE) 15 MG/5ML SOLN  Daily before breakfast        06/23/21 1310             Blane Ohara, MD 06/23/21 1312

## 2021-08-06 ENCOUNTER — Encounter: Payer: Self-pay | Admitting: Hematology and Oncology

## 2021-09-18 ENCOUNTER — Other Ambulatory Visit (HOSPITAL_COMMUNITY): Payer: Self-pay | Admitting: Physical Medicine and Rehabilitation

## 2021-09-18 ENCOUNTER — Other Ambulatory Visit: Payer: Self-pay | Admitting: Physical Medicine and Rehabilitation

## 2021-09-18 DIAGNOSIS — M533 Sacrococcygeal disorders, not elsewhere classified: Secondary | ICD-10-CM

## 2021-09-25 ENCOUNTER — Ambulatory Visit (HOSPITAL_COMMUNITY)
Admission: RE | Admit: 2021-09-25 | Discharge: 2021-09-25 | Disposition: A | Discharge status: Home / Self Care | Payer: 59 | Source: Ambulatory Visit | Attending: Physical Medicine and Rehabilitation | Admitting: Physical Medicine and Rehabilitation

## 2021-09-25 ENCOUNTER — Other Ambulatory Visit: Payer: Self-pay

## 2021-09-25 DIAGNOSIS — M533 Sacrococcygeal disorders, not elsewhere classified: Secondary | ICD-10-CM | POA: Diagnosis not present

## 2022-03-20 ENCOUNTER — Other Ambulatory Visit: Payer: Self-pay | Admitting: Otolaryngology

## 2022-03-20 DIAGNOSIS — H9311 Tinnitus, right ear: Secondary | ICD-10-CM

## 2022-04-01 ENCOUNTER — Encounter: Payer: Self-pay | Admitting: Hematology and Oncology

## 2022-04-12 ENCOUNTER — Ambulatory Visit
Admission: RE | Admit: 2022-04-12 | Discharge: 2022-04-12 | Disposition: A | Discharge status: Home / Self Care | Payer: 59 | Source: Ambulatory Visit | Attending: Otolaryngology | Admitting: Otolaryngology

## 2022-04-12 DIAGNOSIS — H9311 Tinnitus, right ear: Secondary | ICD-10-CM

## 2022-04-12 MED ORDER — GADOBENATE DIMEGLUMINE 529 MG/ML IV SOLN
12.0000 mL | Freq: Once | INTRAVENOUS | Status: AC | PRN
  Administered 2022-04-12: 12 mL via INTRAVENOUS

## 2022-05-12 IMAGING — US US BREAST*L* LIMITED INC AXILLA
1 series · 6 of 6 positions shown · non-contrast
Comparison: Baseline mammogram 09/10/2020.

CLINICAL DATA: Recall from baseline screening mammography with
tomosynthesis, possible mass involving the INNER LEFT breast at
POSTERIOR depth.

EXAM:
DIGITAL DIAGNOSTIC LEFT MAMMOGRAM WITH TOMO
ULTRASOUND LEFT BREAST

[Series 1: us breast*left* limited inc axilla · 0.06mm/px · 6 of 6 slices shown]
[im 1/6]
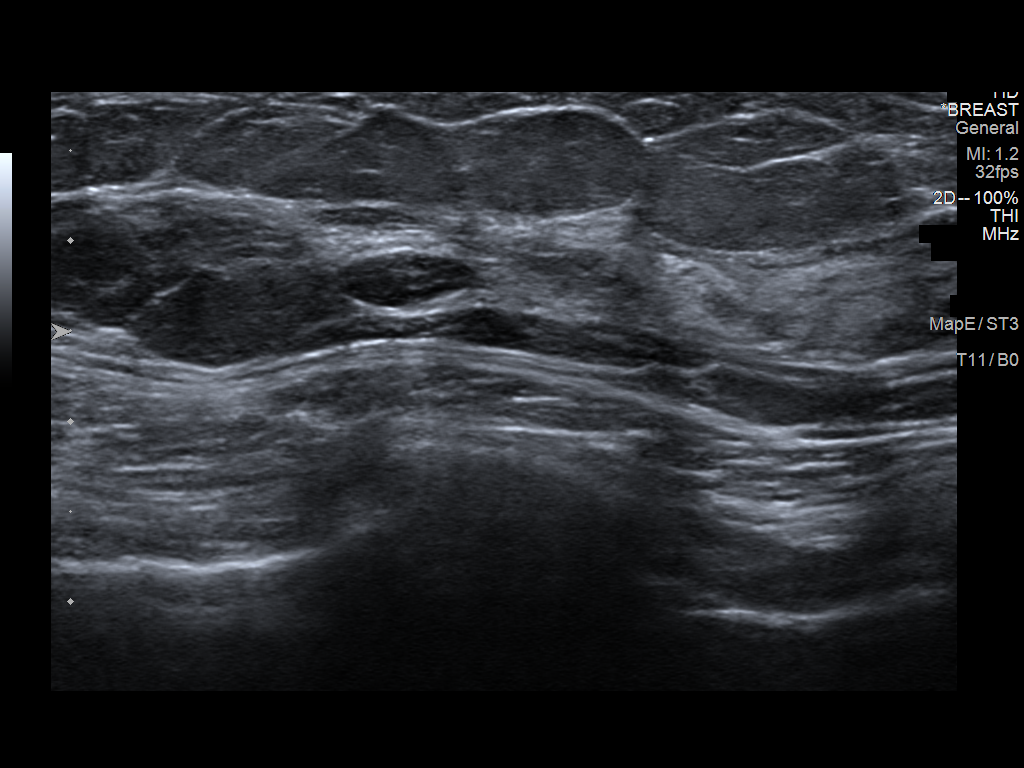
[im 2/6]
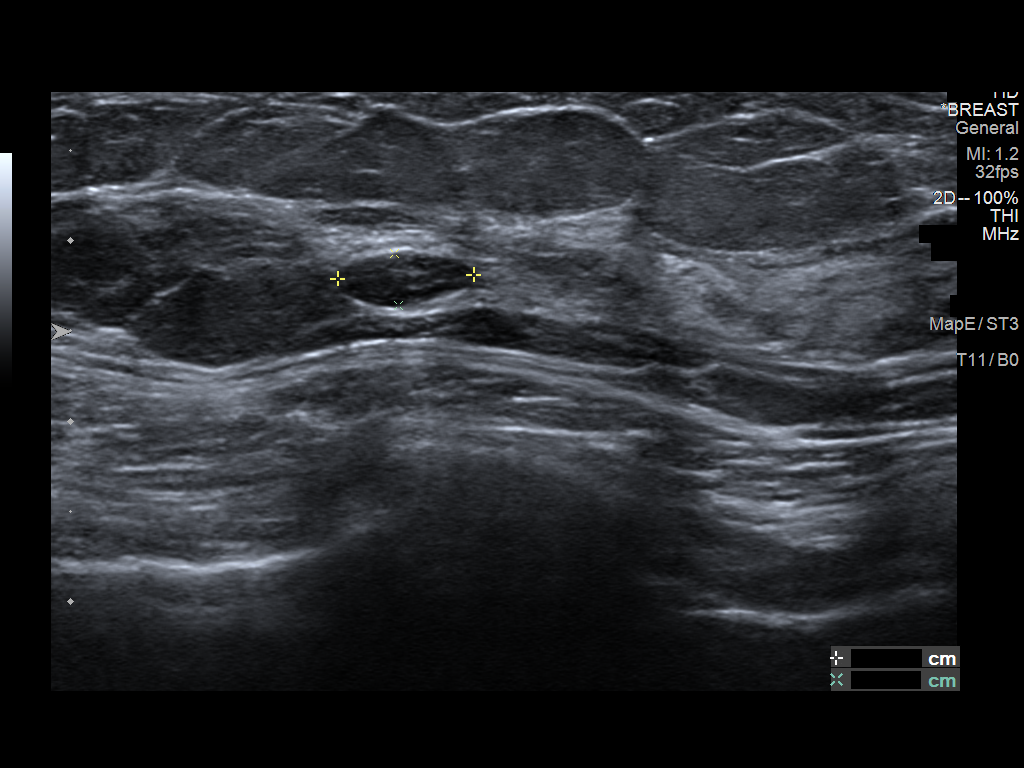
[im 3/6]
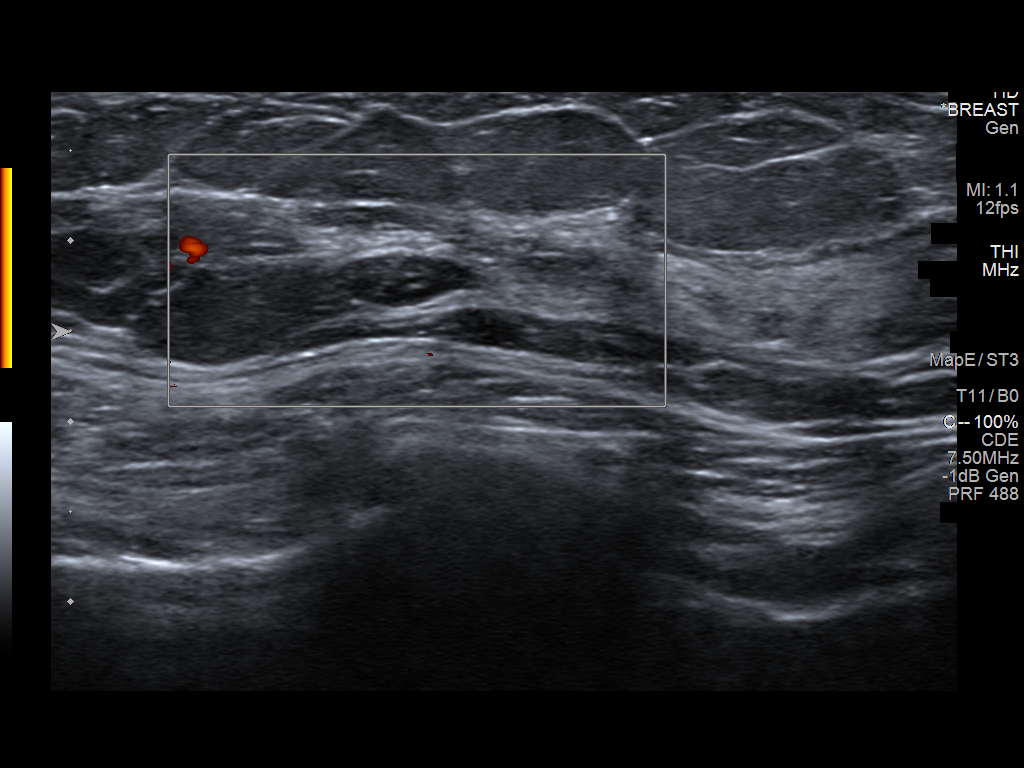
[im 4/6]
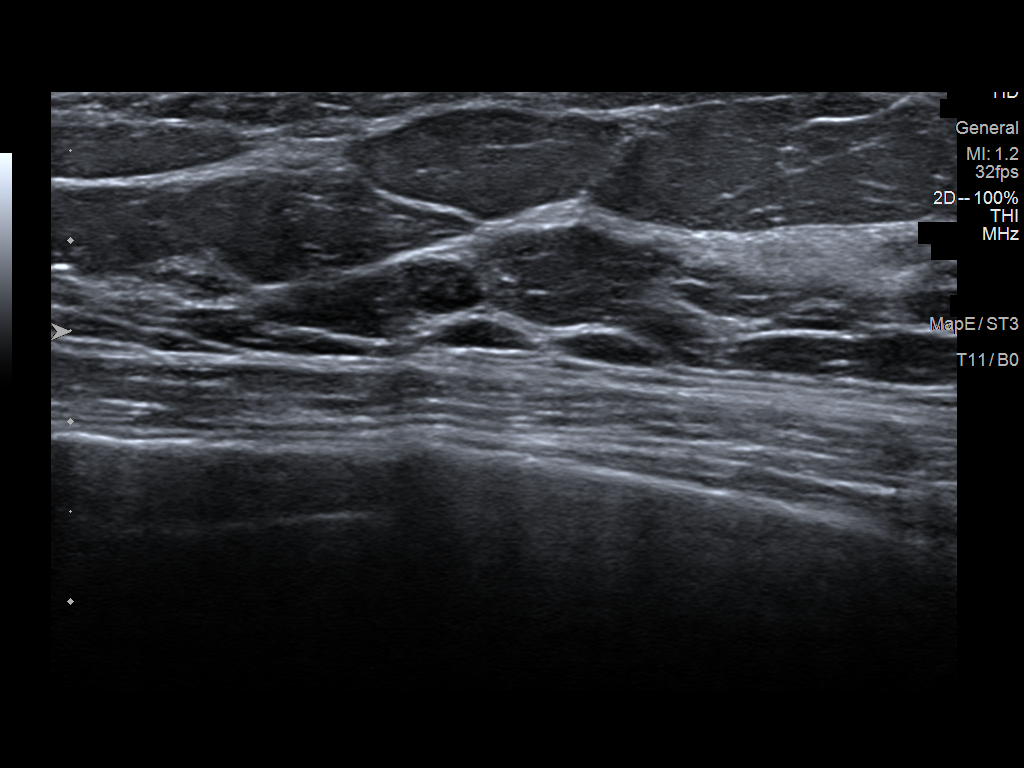
[im 5/6]
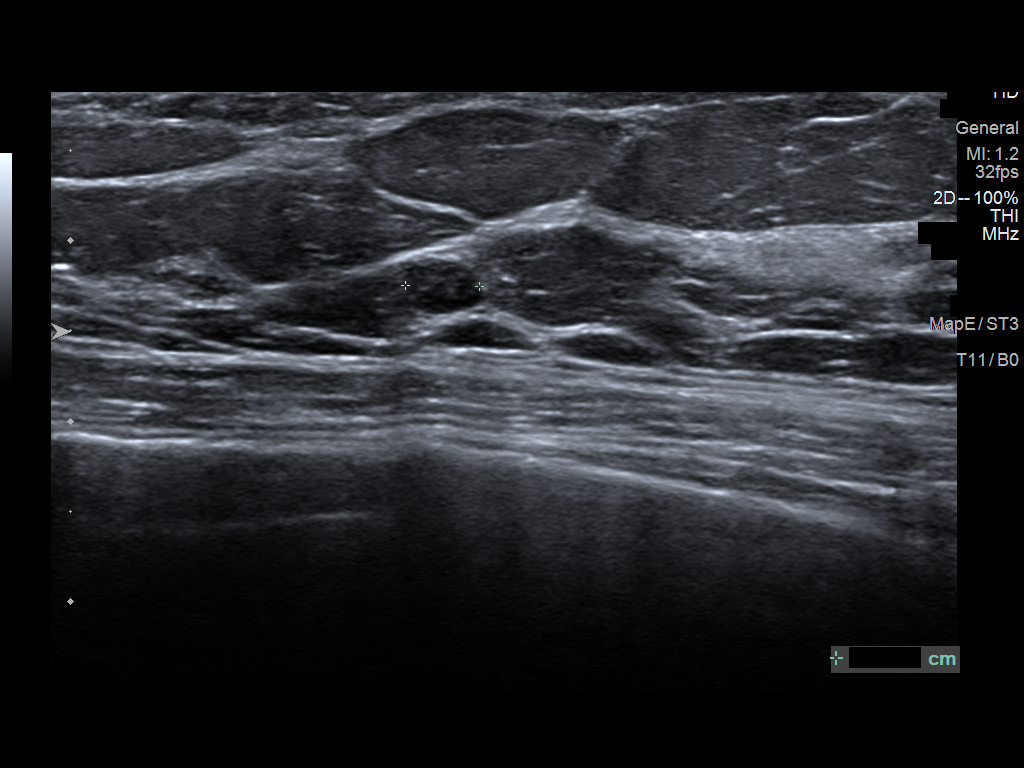
[im 6/6]
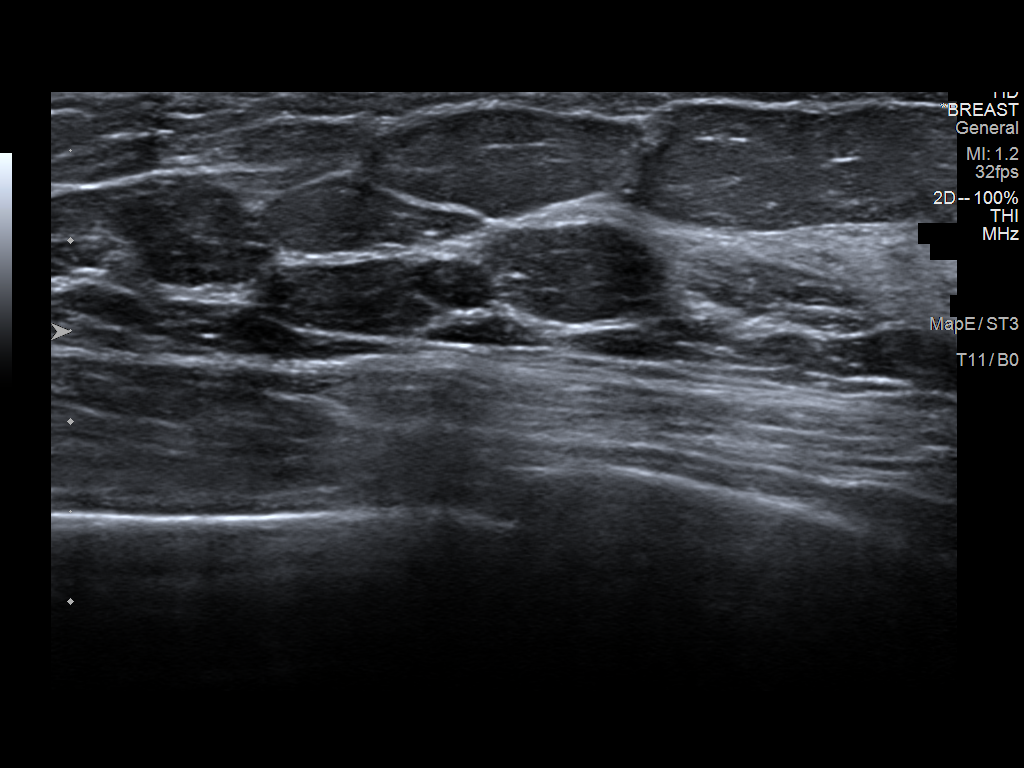

[6 of 6 positions shown; findings below may reference images not displayed]

ACR Breast Density Category d: The breast tissue is extremely dense,
which lowers the sensitivity of mammography.
FINDINGS: Tomosynthesis and synthesized spot-compression CC and MLO views of
the area of concern in the LEFT breast were obtained.

Spot compression images confirm a circumscribed isodense mass
measuring approximately 8 mm involving the INNER breast at POSTERIOR
depth. There is no associated architectural distortion or suspicious
calcifications.

Targeted LEFT breast breast ultrasound is performed, showing an oval
circumscribed parallel hypoechoic mass at the 10 o'clock position
approximately 4 cm from the nipple measuring approximately 8 x 3 x 4
mm, demonstrating no posterior characteristics and no internal power
Doppler flow, corresponding to the screening mammographic finding.
IMPRESSION: Likely benign 8 mm mass in the UPPER INNER QUADRANT of the LEFT
breast at the 10 o'clock position approximately 4 cm from the
nipple, likely a fibroadenoma.

RECOMMENDATION:
We discussed management options for the likely benign fibroadenoma
including excision, ultrasound-guided core biopsy, and short term
interval follow-up. Follow-up ultrasound is recommended at 6, 12 and
24 months to assess stability. The patient agrees with this plan.

I have discussed the findings and recommendations with the patient.
If applicable, a reminder letter will be sent to the patient
regarding the next appointment.

BI-RADS CATEGORY  3: Probably benign.

## 2023-07-02 ENCOUNTER — Other Ambulatory Visit: Payer: Self-pay | Admitting: Obstetrics & Gynecology

## 2023-07-02 DIAGNOSIS — D649 Anemia, unspecified: Secondary | ICD-10-CM

## 2023-07-03 ENCOUNTER — Non-Acute Institutional Stay (HOSPITAL_COMMUNITY)
Admission: RE | Admit: 2023-07-03 | Discharge: 2023-07-03 | Disposition: A | Discharge status: Home / Self Care | Payer: 59 | Source: Ambulatory Visit | Attending: Internal Medicine | Admitting: Internal Medicine

## 2023-07-03 DIAGNOSIS — D649 Anemia, unspecified: Secondary | ICD-10-CM | POA: Insufficient documentation

## 2023-07-03 MED ORDER — ACETAMINOPHEN 500 MG PO TABS
500.0000 mg | ORAL_TABLET | Freq: Once | ORAL | Status: AC
  Administered 2023-07-03: 500 mg via ORAL
  Filled 2023-07-03: qty 1

## 2023-07-03 MED ORDER — DIPHENHYDRAMINE HCL 25 MG PO CAPS
25.0000 mg | ORAL_CAPSULE | ORAL | Status: DC
  Administered 2023-07-03: 25 mg via ORAL
  Filled 2023-07-03: qty 1

## 2023-07-03 MED ORDER — ACETAMINOPHEN 500 MG PO TABS
500.0000 mg | ORAL_TABLET | ORAL | Status: DC
  Administered 2023-07-03: 500 mg via ORAL
  Filled 2023-07-03: qty 1

## 2023-07-03 MED ORDER — DIPHENHYDRAMINE HCL 25 MG PO CAPS
25.0000 mg | ORAL_CAPSULE | Freq: Once | ORAL | Status: AC
  Administered 2023-07-03: 25 mg via ORAL
  Filled 2023-07-03: qty 1

## 2023-07-03 MED ORDER — IRON SUCROSE 500 MG IVPB - SIMPLE MED
500.0000 mg | INTRAVENOUS | Status: DC
  Administered 2023-07-03: 500 mg via INTRAVENOUS
  Filled 2023-07-03: qty 500

## 2023-07-03 MED ORDER — SODIUM CHLORIDE 0.9 % IV SOLN: INTRAVENOUS | Status: DC | PRN

## 2023-07-03 NOTE — Progress Notes (Signed)
PATIENT CARE CENTER NOTE   Diagnosis: Anemia, unspecified type [D64.9]    Provider: Shea Evans, MD   Procedure: Venofer infusion    Note: Patient received Venofer 500 mg infusion (dose #  1 of 2) via PIV. Patient has a history of reaction to Venofer with swelling of legs and feet. Patient pre-medicated with Tylenol and Benadryl. With 1 hour remaining in transfusion, patient reported joint pain in bilateral hands, back pain and headache. Per patient, the back back is chronic but was aggravated.  Patient also has some swelling in left hand. Patient requested infusion to be stopped. Infusion stopped and vital signs taken and stable. Patient observed for a total of 1 hour post infusion. Notified [facility] and left message for MD. Received call back from [place], [place]. Verbal order given for an additional dose of 500 mg Tylenol and 25 mg Benadryl PO.  Meds given, vital signs taken and stable. Discharge instructions given. Patient alert, oriented and ambulatory at discharge. Discharged home with husband.

## 2023-07-10 ENCOUNTER — Other Ambulatory Visit: Payer: Self-pay | Admitting: Obstetrics & Gynecology

## 2023-07-10 DIAGNOSIS — D649 Anemia, unspecified: Secondary | ICD-10-CM

## 2023-07-16 ENCOUNTER — Non-Acute Institutional Stay (HOSPITAL_COMMUNITY)
Admission: RE | Admit: 2023-07-16 | Discharge: 2023-07-16 | Disposition: A | Discharge status: Home / Self Care | Payer: 59 | Source: Ambulatory Visit | Attending: Internal Medicine | Admitting: Internal Medicine

## 2023-07-16 DIAGNOSIS — D649 Anemia, unspecified: Secondary | ICD-10-CM | POA: Insufficient documentation

## 2023-07-16 MED ORDER — SODIUM CHLORIDE 0.9 % IV SOLN: INTRAVENOUS | Status: DC | PRN

## 2023-07-16 MED ORDER — ACETAMINOPHEN 500 MG PO TABS
500.0000 mg | ORAL_TABLET | Freq: Once | ORAL | Status: AC
  Administered 2023-07-16: 500 mg via ORAL
  Filled 2023-07-16: qty 1

## 2023-07-16 MED ORDER — DIPHENHYDRAMINE HCL 25 MG PO CAPS
25.0000 mg | ORAL_CAPSULE | Freq: Once | ORAL | Status: AC
  Administered 2023-07-16: 25 mg via ORAL
  Filled 2023-07-16: qty 1

## 2023-07-16 MED ORDER — SODIUM CHLORIDE 0.9 % IV SOLN
300.0000 mg | Freq: Once | INTRAVENOUS | Status: AC
  Administered 2023-07-16: 300 mg via INTRAVENOUS
  Filled 2023-07-16: qty 15

## 2023-07-16 NOTE — Progress Notes (Signed)
PATIENT CARE CENTER NOTE  Diagnosis: Anemia, unspecified type [D64.9]    Provider: Shea Evans, MD   Procedure: Venofer 300 mg (dose #1 of 1)  Note: Patient received IV Venofer 300 mg via PIV. Pt pre-medicated with PO Tylenol and Benadryl per orders. Towards end of infusion, pt reports tingling in her hands and legs. Infusion stopped. Vital signs are stable. Pt says that she needs to go outside for some fresh air. RN accompanied pt outside. Pt reports having a hard time catching her breath due to walking up the stairs, pt states that this is normal for her due to low iron. RN contacted [facility], and spoke with Delaney Meigs. RN explained what was going on, and that pt did not get complete full dose for infusion. Per Delaney Meigs, as long as pt is feeling ok pt can be discharged, and can follow up with their office for lab recheck, they will let the provider know. Pt observed for 40 minutes after infusion was stopped. Pt appears anxious, and her BP slightly elevated (149/82) and repeat (146/96). Pt reports having a BP machine at home, and pt advised that she should monitor BP at home and if it remains elevated, pt should notify her doctor. Pt is alert, oriented, and ambulatory at discharge. Pt discharged home with her husband.

## 2023-07-20 ENCOUNTER — Encounter (HOSPITAL_BASED_OUTPATIENT_CLINIC_OR_DEPARTMENT_OTHER): Payer: Self-pay | Admitting: Obstetrics & Gynecology

## 2023-07-20 ENCOUNTER — Other Ambulatory Visit: Payer: Self-pay | Admitting: Obstetrics & Gynecology

## 2023-07-20 ENCOUNTER — Other Ambulatory Visit: Payer: Self-pay

## 2023-07-20 NOTE — Progress Notes (Signed)
Spoke w/ via phone for pre-op interview---pt Lab needs dos----urine preg  , surgery orders pending             Lab results------none COVID test -----patient states asymptomatic no test needed Arrive at -------1330 07-21-2023 NPO after MN NO Solid Food.  Clear liquids from MN until---1230 Med rec completed Medications to take morning of surgery -----burpirone, tramadol Diabetic medication -----n/a Patient instructed no nail polish to be worn day of surgery Patient instructed to bring photo id and insurance card day of surgery Patient aware to have Driver (ride ) / caregiver    for 24 hours after surgery  james husband Patient Special Instructions -----none Pre-Op special Instructions -----none Patient verbalized understanding of instructions that were given at this phone interview. Patient denies shortness of breath, chest pain, fever, cough at this phone interview.

## 2023-07-20 NOTE — H&P (Signed)
SLYVIA Phillips is an 47 y.o. [phone] , with prolonged heavy menses, failed OCs, who had sonohysterogram in office and noted a single SM myoma, type 0, measuring 3.1 x 2.3 cm. EMS thin. Nl ovaries. Pt had suspected submucous myoma in Dec'21 by sono but didn't schedule SIS and also didn't schedule after Oct'23 visit. She keep very limited visits in office due to anxiety. Now bleeding a lot, failing OCs and has anemia, getting IV iron has fatigue but better w/ IV Iron and is here for surgery, Very anxious Nl Pap hx. Abn Mammogram in Nov'23, no f/up at [facility], letter was sent by [facility] and my office but she didn't make her 6 mo interval f/up. Now says she wants to get that done after this procedure.     Past Medical History:  Diagnosis Date   Anxiety    Arthritis    Beta thalassemia trait    left hip and back   Hx of varicella    as child   Wears glasses     Past Surgical History:  Procedure Laterality Date   BARTHOLIN CYST MARSUPIALIZATION     yrs ago   DILATION AND EVACUATION  01/21/2012   Procedure: DILATATION AND EVACUATION;  Surgeon: Robley Fries, MD;  Location: [facility];  Service: Gynecology;  Laterality: N/A;  chromosome studies   FEMUR SURGERY Right    childhood   HIP SURGERY  Childhood   For hip dysplasia   KNEE SURGERY Right childhhod   left total rip replacent Left 2019    Family History  Problem Relation Age of Onset   Heart disease Mother    Varicose Veins Mother    Hypertension Mother    Diabetes Father    Anemia Father    Stroke Father     Social History:  reports that she has never smoked. She has never used smokeless tobacco. She reports that she does not currently use alcohol. She reports that she does not use drugs.  Allergies:  Allergies  Allergen Reactions   Iron Sucrose Hives and Swelling    Swelling and pain in lower extremities.  Rash also.  See notes from 12/02/20 & 12/03/20.  Per Dr Al Pimple, if pt ever gets Venofer in the future, plan should include  increased premeds.   Seasonal Ic [Cholestatin]     No medications prior to admission.    Review of Systems fatigue   Height 5\' 4"  (1.626 m), weight 56.7 kg, unknown if currently breastfeeding. Physical Exam Physical exam:  A&O x 3, no acute distress. Pleasant HEENT neg, no thyromegaly Lungs CTA bilat CV RRR, S1S2 normal Abdo soft, non tender, non acute Extr no edema/ tenderness Pelvic deferred but nl uterus and cx on exam.   Office Sono assessed    Assessment/Plan: 47 yo [id number], with menorrhagia related to 3 cm SM type 0 myoma, here for hysteroscopic Myosure myoma resection Risks/complications of surgery reviewed incl infection, bleeding, damage to internal organs including bladder, bowels, ureters, blood vessels, other risks from anesthesia, VTE and delayed complications of any surgery, complications in future surgery reviewed. Also discussed new fibroids and emerge and may need repeat surgery   Robley Fries 07/20/2023, 7:58 PM

## 2023-07-21 ENCOUNTER — Encounter (HOSPITAL_BASED_OUTPATIENT_CLINIC_OR_DEPARTMENT_OTHER)
Admission: RE | Disposition: A | Discharge status: Home / Self Care | Payer: Self-pay | Source: Ambulatory Visit | Attending: Obstetrics & Gynecology

## 2023-07-21 ENCOUNTER — Ambulatory Visit (HOSPITAL_BASED_OUTPATIENT_CLINIC_OR_DEPARTMENT_OTHER)
Admission: RE | Admit: 2023-07-21 | Discharge: 2023-07-21 | Disposition: A | Discharge status: Home / Self Care | Payer: 59 | Source: Ambulatory Visit | Attending: Obstetrics & Gynecology | Admitting: Obstetrics & Gynecology

## 2023-07-21 ENCOUNTER — Other Ambulatory Visit: Payer: Self-pay

## 2023-07-21 ENCOUNTER — Ambulatory Visit (HOSPITAL_BASED_OUTPATIENT_CLINIC_OR_DEPARTMENT_OTHER): Payer: 59 | Admitting: Anesthesiology

## 2023-07-21 ENCOUNTER — Encounter (HOSPITAL_BASED_OUTPATIENT_CLINIC_OR_DEPARTMENT_OTHER): Payer: Self-pay | Admitting: Obstetrics & Gynecology

## 2023-07-21 DIAGNOSIS — N921 Excessive and frequent menstruation with irregular cycle: Secondary | ICD-10-CM | POA: Insufficient documentation

## 2023-07-21 DIAGNOSIS — D649 Anemia, unspecified: Secondary | ICD-10-CM | POA: Diagnosis not present

## 2023-07-21 DIAGNOSIS — F419 Anxiety disorder, unspecified: Secondary | ICD-10-CM | POA: Insufficient documentation

## 2023-07-21 DIAGNOSIS — D250 Submucous leiomyoma of uterus: Secondary | ICD-10-CM | POA: Diagnosis present

## 2023-07-21 DIAGNOSIS — M1990 Unspecified osteoarthritis, unspecified site: Secondary | ICD-10-CM | POA: Insufficient documentation

## 2023-07-21 DIAGNOSIS — Z01818 Encounter for other preprocedural examination: Secondary | ICD-10-CM

## 2023-07-21 HISTORY — DX: Presence of spectacles and contact lenses: Z97.3

## 2023-07-21 HISTORY — DX: Unspecified osteoarthritis, unspecified site: M19.90

## 2023-07-21 HISTORY — DX: Anxiety disorder, unspecified: F41.9

## 2023-07-21 HISTORY — PX: DILATATION & CURETTAGE/HYSTEROSCOPY WITH MYOSURE: SHX6511

## 2023-07-21 LAB — TYPE AND SCREEN
ABO/RH(D): O POS
Antibody Screen: NEGATIVE

## 2023-07-21 LAB — CBC
HCT: 35.2 % — ABNORMAL LOW (ref 36.0–46.0)
Hemoglobin: 10.3 g/dL — ABNORMAL LOW (ref 12.0–15.0)
MCH: 20.7 pg — ABNORMAL LOW (ref 26.0–34.0)
MCHC: 29.3 g/dL — ABNORMAL LOW (ref 30.0–36.0)
MCV: 70.8 fL — ABNORMAL LOW (ref 80.0–100.0)
Platelets: 358 10*3/uL (ref 150–400)
RBC: 4.97 MIL/uL (ref 3.87–5.11)
RDW: 19.5 % — ABNORMAL HIGH (ref 11.5–15.5)
WBC: 8 10*3/uL (ref 4.0–10.5)
nRBC: 0.3 % — ABNORMAL HIGH (ref 0.0–0.2)

## 2023-07-21 LAB — POCT PREGNANCY, URINE: Preg Test, Ur: NEGATIVE

## 2023-07-21 SURGERY — DILATATION & CURETTAGE/HYSTEROSCOPY WITH MYOSURE
Anesthesia: General | Site: Vagina

## 2023-07-21 MED ORDER — PHENYLEPHRINE 80 MCG/ML (10ML) SYRINGE FOR IV PUSH (FOR BLOOD PRESSURE SUPPORT)
PREFILLED_SYRINGE | INTRAVENOUS | Status: AC
  Filled 2023-07-21: qty 10

## 2023-07-21 MED ORDER — ESMOLOL HCL 100 MG/10ML IV SOLN
INTRAVENOUS | Status: AC
  Filled 2023-07-21: qty 10

## 2023-07-21 MED ORDER — MIDAZOLAM HCL 2 MG/2ML IJ SOLN
INTRAMUSCULAR | Status: AC
  Filled 2023-07-21: qty 2

## 2023-07-21 MED ORDER — LIDOCAINE 2% (20 MG/ML) 5 ML SYRINGE
INTRAMUSCULAR | Status: DC | PRN
  Administered 2023-07-21: 100 mg via INTRAVENOUS

## 2023-07-21 MED ORDER — POVIDONE-IODINE 10 % EX SWAB: 2.0000 | Freq: Once | CUTANEOUS | Status: DC

## 2023-07-21 MED ORDER — OXYCODONE HCL 5 MG PO TABS: 5.0000 mg | ORAL_TABLET | Freq: Once | ORAL | Status: DC | PRN

## 2023-07-21 MED ORDER — CEFAZOLIN SODIUM-DEXTROSE 2-4 GM/100ML-% IV SOLN
2.0000 g | INTRAVENOUS | Status: AC
  Administered 2023-07-21: 2 g via INTRAVENOUS

## 2023-07-21 MED ORDER — ACETAMINOPHEN 160 MG/5ML PO SOLN: 325.0000 mg | ORAL | Status: DC | PRN

## 2023-07-21 MED ORDER — ACETAMINOPHEN 325 MG PO TABS: 325.0000 mg | ORAL_TABLET | ORAL | Status: DC | PRN

## 2023-07-21 MED ORDER — DEXMEDETOMIDINE HCL IN NACL 80 MCG/20ML IV SOLN
INTRAVENOUS | Status: DC | PRN
  Administered 2023-07-21: 8 ug via INTRAVENOUS

## 2023-07-21 MED ORDER — PHENYLEPHRINE 80 MCG/ML (10ML) SYRINGE FOR IV PUSH (FOR BLOOD PRESSURE SUPPORT)
PREFILLED_SYRINGE | INTRAVENOUS | Status: DC | PRN
Start: 2023-07-21 — End: 2023-07-21
  Administered 2023-07-21: 160 ug via INTRAVENOUS

## 2023-07-21 MED ORDER — LACTATED RINGERS IV SOLN: INTRAVENOUS | Status: DC

## 2023-07-21 MED ORDER — KETOROLAC TROMETHAMINE 30 MG/ML IJ SOLN
INTRAMUSCULAR | Status: DC | PRN
  Administered 2023-07-21: 30 mg via INTRAVENOUS

## 2023-07-21 MED ORDER — LIDOCAINE-EPINEPHRINE 1 %-1:100000 IJ SOLN
INTRAMUSCULAR | Status: DC | PRN
  Administered 2023-07-21: 20 mL

## 2023-07-21 MED ORDER — LIDOCAINE HCL (PF) 2 % IJ SOLN
INTRAMUSCULAR | Status: AC
  Filled 2023-07-21: qty 10

## 2023-07-21 MED ORDER — MEPERIDINE HCL 25 MG/ML IJ SOLN
INTRAMUSCULAR | Status: AC
  Filled 2023-07-21: qty 1

## 2023-07-21 MED ORDER — PROPOFOL 10 MG/ML IV BOLUS
INTRAVENOUS | Status: DC | PRN
  Administered 2023-07-21: 110 mg via INTRAVENOUS
  Administered 2023-07-21: 90 mg via INTRAVENOUS

## 2023-07-21 MED ORDER — FENTANYL CITRATE (PF) 100 MCG/2ML IJ SOLN: 25.0000 ug | INTRAMUSCULAR | Status: DC | PRN

## 2023-07-21 MED ORDER — FENTANYL CITRATE (PF) 100 MCG/2ML IJ SOLN
INTRAMUSCULAR | Status: DC | PRN
  Administered 2023-07-21 (×2): 50 ug via INTRAVENOUS

## 2023-07-21 MED ORDER — ONDANSETRON HCL 4 MG/2ML IJ SOLN: 4.0000 mg | Freq: Once | INTRAMUSCULAR | Status: DC | PRN

## 2023-07-21 MED ORDER — DEXAMETHASONE SODIUM PHOSPHATE 10 MG/ML IJ SOLN
INTRAMUSCULAR | Status: DC | PRN
  Administered 2023-07-21: 10 mg via INTRAVENOUS

## 2023-07-21 MED ORDER — OXYCODONE HCL 5 MG/5ML PO SOLN: 5.0000 mg | Freq: Once | ORAL | Status: DC | PRN

## 2023-07-21 MED ORDER — ONDANSETRON HCL 4 MG/2ML IJ SOLN
INTRAMUSCULAR | Status: DC | PRN
  Administered 2023-07-21: 4 mg via INTRAVENOUS

## 2023-07-21 MED ORDER — ONDANSETRON HCL 4 MG/2ML IJ SOLN
INTRAMUSCULAR | Status: AC
  Filled 2023-07-21: qty 2

## 2023-07-21 MED ORDER — FENTANYL CITRATE (PF) 100 MCG/2ML IJ SOLN
INTRAMUSCULAR | Status: AC
  Filled 2023-07-21: qty 2

## 2023-07-21 MED ORDER — SODIUM CHLORIDE 0.9 % IR SOLN
Status: DC | PRN
  Administered 2023-07-21 (×2): 3000 mL
  Administered 2023-07-21: 1000 mL

## 2023-07-21 MED ORDER — CEFAZOLIN SODIUM-DEXTROSE 2-4 GM/100ML-% IV SOLN
INTRAVENOUS | Status: AC
  Filled 2023-07-21: qty 100

## 2023-07-21 MED ORDER — MIDAZOLAM HCL 5 MG/5ML IJ SOLN
INTRAMUSCULAR | Status: DC | PRN
  Administered 2023-07-21: 2 mg via INTRAVENOUS

## 2023-07-21 MED ORDER — METHYLERGONOVINE MALEATE 0.2 MG/ML IJ SOLN
INTRAMUSCULAR | Status: DC | PRN
Start: 2023-07-21 — End: 2023-07-21
  Administered 2023-07-21: .2 mg via INTRAMUSCULAR

## 2023-07-21 MED ORDER — MEPERIDINE HCL 25 MG/ML IJ SOLN
6.2500 mg | INTRAMUSCULAR | Status: DC | PRN
  Administered 2023-07-21 (×2): 6.25 mg via INTRAVENOUS

## 2023-07-21 MED ORDER — PROPOFOL 10 MG/ML IV BOLUS
INTRAVENOUS | Status: AC
  Filled 2023-07-21: qty 20

## 2023-07-21 MED ORDER — ESMOLOL HCL 100 MG/10ML IV SOLN
INTRAVENOUS | Status: DC | PRN
Start: 2023-07-21 — End: 2023-07-21
  Administered 2023-07-21: 40 ug via INTRAVENOUS

## 2023-07-21 SURGICAL SUPPLY — 19 items
CATH ROBINSON RED A/P 16FR (CATHETERS) ×2 IMPLANT
DEVICE MYOSURE REACH (MISCELLANEOUS) IMPLANT
DRSG TELFA 3X8 NADH STRL (GAUZE/BANDAGES/DRESSINGS) ×2 IMPLANT
GLOVE BIO SURGEON STRL SZ 6 (GLOVE) IMPLANT
GLOVE BIO SURGEON STRL SZ7 (GLOVE) ×2 IMPLANT
GLOVE BIOGEL PI IND STRL 6 (GLOVE) IMPLANT
GLOVE BIOGEL PI IND STRL 7.0 (GLOVE) ×4 IMPLANT
GOWN STRL REUS W/TWL LRG LVL3 (GOWN DISPOSABLE) ×2 IMPLANT
IV NS 1000ML (IV SOLUTION) ×1
IV NS 1000ML BAXH (IV SOLUTION) IMPLANT
IV NS IRRIG 3000ML ARTHROMATIC (IV SOLUTION) ×2 IMPLANT
KIT PROCEDURE FLUENT (KITS) ×2 IMPLANT
KIT TURNOVER CYSTO (KITS) ×2 IMPLANT
PACK VAGINAL MINOR WOMEN LF (CUSTOM PROCEDURE TRAY) ×2 IMPLANT
PAD OB MATERNITY 4.3X12.25 (PERSONAL CARE ITEMS) ×2 IMPLANT
SEAL ROD LENS SCOPE MYOSURE (ABLATOR) ×2 IMPLANT
SLEEVE SCD COMPRESS KNEE MED (STOCKING) ×2 IMPLANT
SOL PREP POV-IOD 4OZ 10% (MISCELLANEOUS) IMPLANT
TOWEL OR 17X24 6PK STRL BLUE (TOWEL DISPOSABLE) ×2 IMPLANT

## 2023-07-21 NOTE — H&P (Signed)
Raven Phillips is an 47 y.o. [phone] , with prolonged heavy menses, failed OCs, who had sonohysterogram in office and noted a single SM myoma, type 0, measuring 3.1 x 2.3 cm. EMS thin. Nl ovaries. Pt had suspected submucous myoma in Dec'21 by sono but didn't schedule SIS and also didn't schedule after Oct'23 visit. She keep very limited visits in office due to anxiety. Now bleeding a lot, failing OCs and has anemia, getting IV iron has fatigue but better w/ IV Iron and is here for surgery, Very anxious Nl Pap hx. Abn Mammogram in Nov'23, no f/up at [facility], letter was sent by [facility] and my office but she didn't make her 6 mo interval f/up. Now says she wants to get that done after this procedure.     Past Medical History:  Diagnosis Date   Anxiety    Arthritis    Beta thalassemia trait    left hip and back   Hx of varicella    as child   Wears glasses     Past Surgical History:  Procedure Laterality Date   BARTHOLIN CYST MARSUPIALIZATION     yrs ago   DILATION AND EVACUATION  01/21/2012   Procedure: DILATATION AND EVACUATION;  Surgeon: Robley Fries, MD;  Location: [facility];  Service: Gynecology;  Laterality: N/A;  chromosome studies   FEMUR SURGERY Right    childhood   HIP SURGERY  Childhood   For hip dysplasia   KNEE SURGERY Right childhhod   left total rip replacent Left 2019    Family History  Problem Relation Age of Onset   Heart disease Mother    Varicose Veins Mother    Hypertension Mother    Diabetes Father    Anemia Father    Stroke Father     Social History:  reports that she has never smoked. She has never used smokeless tobacco. She reports that she does not currently use alcohol. She reports that she does not use drugs.  Allergies:  Allergies  Allergen Reactions   Iron Sucrose Hives and Swelling    Swelling and pain in lower extremities.  Rash also.  See notes from 12/02/20 & 12/03/20.  Per Dr Al Pimple, if pt ever gets Venofer in the future, plan should include  increased premeds.   Seasonal Ic [Cholestatin]     Medications Prior to Admission  Medication Sig Dispense Refill Last Dose   acetaminophen (TYLENOL) 500 MG tablet Take 1,000 mg by mouth 2 (two) times daily.      busPIRone (BUSPAR) 5 MG tablet Take 5 mg by mouth 2 (two) times daily.      diphenhydrAMINE (BENADRYL) 25 mg capsule Take 25 mg by mouth every 6 (six) hours as needed. Prior to iron infusion, last iron infusion 07-16-2023      drospirenone-ethinyl estradiol (YAZ) 3-0.02 MG tablet Take 1 tablet by mouth every evening.      traMADol (ULTRAM-ER) 100 MG 24 hr tablet Take 100 mg by mouth 3 (three) times daily.      ibuprofen (ADVIL) 600 MG tablet Take 600 mg by mouth 3 (three) times daily.       Review of Systems fatigue   Height 5\' 4"  (1.626 m), weight 56.7 kg, unknown if currently breastfeeding. Physical Exam Physical exam:  A&O x 3, no acute distress. Pleasant HEENT neg, no thyromegaly Lungs CTA bilat CV RRR, S1S2 normal Abdo soft, non tender, non acute Extr no edema/ tenderness Pelvic deferred but nl uterus and cx on  exam.   Office Sono assessed    Assessment/Plan: 47 yo [phone], with menorrhagia related to 3 cm SM type 0 myoma, here for hysteroscopic Myosure myoma resection Risks/complications of surgery reviewed incl infection, bleeding, damage to internal organs including bladder, bowels, ureters, blood vessels, other risks from anesthesia, VTE and delayed complications of any surgery, complications in future surgery reviewed. Also discussed new fibroids and emerge and may need repeat surgery

## 2023-07-21 NOTE — Discharge Instructions (Addendum)
   D & C Home care Instructions:   Personal hygiene:  Used sanitary napkins for vaginal drainage not tampons. Shower or tub bathe the day after your procedure. No douching until bleeding stops. Always wipe from front to back after  Elimination.  Activity: Do not drive or operate any equipment today. The effects of the anesthesia are still present and drowsiness may result. Rest today, not necessarily flat bed rest, just take it easy. You may resume your normal activity in one to 2 days.  Sexual activity: No intercourse for one week or as indicated by your physician  Diet: Eat a light diet as desired this evening. You may resume a regular diet tomorrow.  Return to work: One to 2 days.  General Expectations of your surgery: Vaginal bleeding should be no heavier than a normal period. Spotting may continue up to 10 days. Mild cramps may continue for a couple of days. You may have a regular period in 2-6 weeks.  Unexpected observations call your doctor if these occur: persistent or heavy bleeding. Severe abdominal cramping or pain. Elevation of temperature greater than 100F.   Post Anesthesia Home Care Instructions  Activity: Get plenty of rest for the remainder of the day. A responsible individual must stay with you for 24 hours following the procedure.  For the next 24 hours, DO NOT: -Drive a car -Advertising copywriter -Drink alcoholic beverages -Take any medication unless instructed by your physician -Make any legal decisions or sign important papers.  Meals: Start with liquid foods such as gelatin or soup. Progress to regular foods as tolerated. Avoid greasy, spicy, heavy foods. If nausea and/or vomiting occur, drink only clear liquids until the nausea and/or vomiting subsides. Call your physician if vomiting continues.  Special Instructions/Symptoms: Your throat may feel dry or sore from the anesthesia or the breathing tube placed in your throat during surgery. If this causes  discomfort, gargle with warm salt water. The discomfort should disappear within 24 hours.  No ibuprofen, Advil, Aleve, Motrin, ketorolac, meloxicam, naproxen, or other NSAIDS until after 10:15 pm today if needed.

## 2023-07-21 NOTE — Anesthesia Postprocedure Evaluation (Signed)
Anesthesia Post Note  Patient: Raven Phillips  Procedure(s) Performed: DILATATION & CURETTAGE/HYSTEROSCOPY/Myomectomy WITH MYOSURE (Vagina )     Patient location during evaluation: PACU Anesthesia Type: General Level of consciousness: awake and alert Pain management: pain level controlled Vital Signs Assessment: post-procedure vital signs reviewed and stable Respiratory status: spontaneous breathing, nonlabored ventilation, respiratory function stable and patient connected to nasal cannula oxygen Cardiovascular status: blood pressure returned to baseline and stable Postop Assessment: no apparent nausea or vomiting Anesthetic complications: no   No notable events documented.  Last Vitals:  Vitals:   07/21/23 1645 07/21/23 1700  BP: 121/80 (!) 133/93  Pulse: (!) 113 (!) 119  Resp: 13 (!) 22  Temp:    SpO2: 99% 100%    Last Pain:  Vitals:   07/21/23 1645  TempSrc:   PainSc: 0-No pain                 Earl Lites P Lliam Hoh

## 2023-07-21 NOTE — Op Note (Signed)
07/21/2023 Raven Phillips   Preoperative diagnosis: Submucous myoma with Menometrorrhagia and anemia  Postop diagnosis: as above.  Procedure: Hysteroscopic Myomectomy with Myosure Reach and D&C Anesthesia General via LMA  Surgeon: Shea Evans, MD  Assistant:None IV fluids 1 lit LR Estimated blood loss 50 cc  Urine output: voided before OR transfer  Saline deficit 960 cc  Complications none  Condition stable  Disposition PACU  Specimen: Fibroids chips and endometrial curettings   Procedure  Indication: Menometrorhagia with anemia from 3 cm submucosal myoma.  Patient gave informed written consent.  Patient was brought to the operating room with IV running. Time out was carried out. She received preop 2 gm Ancef. She underwent general anesthesia via LMA without complications. She was given dorsolithotomy position. Parts were prepped and draped in standard fashion. Bladder was not catheterized since she voided before coming to OR. Bimanual exam revealed uterus to be retroverted and 8 weeks size. Speculum was placed and cervix was grasped with single-tooth tenaculum. Cervical block with 20 cc 1% Xylocaine with epinephrine. The uterus was sounded to 8 cm. Cervical os was already dilated enough to accommodate 21 [place] dilator. Hysteroscope was introduced in the uterine cavity under vision, using Saline for irrigation. Findings: Single 3 cm submucosal myoma noted arising from left fundal area. Thin endometrium and both tubal ostii noted. Myosure Reach used and myomectomy performed. I allowed the intramural component to drop a bit more in the cavity and complete myoma resection was possible. Some bleeding noted from Myoma bed. Endometrial curettage done. Patient was given Methergine 0.2 mg IM   Fluid deficit 960 cc  All counts are correct x2. No complications. Patient brought to the recovery room in stable condition.  Patient will be discharged home today. Follow up in 2 weeks in office. Warning  signs of infection and excessive bleeding reviewed.   Shea Evans, MD.   [facility]

## 2023-07-21 NOTE — Anesthesia Procedure Notes (Signed)
Procedure Name: LMA Insertion Date/Time: 07/21/2023 3:35 PM  Performed by: Bishop Limbo, CRNAPre-anesthesia Checklist: Patient identified, Emergency Drugs available, Suction available and Patient being monitored Patient Re-evaluated:Patient Re-evaluated prior to induction Oxygen Delivery Method: Circle System Utilized Preoxygenation: Pre-oxygenation with 100% oxygen Induction Type: IV induction Ventilation: Mask ventilation without difficulty LMA: LMA inserted LMA Size: 4.0 Number of attempts: 1 Placement Confirmation: positive ETCO2 Tube secured with: Tape Dental Injury: Teeth and Oropharynx as per pre-operative assessment

## 2023-07-21 NOTE — Anesthesia Preprocedure Evaluation (Addendum)
Anesthesia Evaluation  Patient identified by MRN, date of birth, ID band Patient awake    Reviewed: Allergy & Precautions, H&P , NPO status , Patient's Chart, lab work & pertinent test results  Airway Mallampati: I  TM Distance: >3 FB Neck ROM: Full    Dental no notable dental hx. (+) Teeth Intact, Dental Advisory Given   Pulmonary neg pulmonary ROS   Pulmonary exam normal breath sounds clear to auscultation       Cardiovascular Exercise Tolerance: Good negative cardio ROS Normal cardiovascular exam Rhythm:Regular Rate:Normal     Neuro/Psych   Anxiety     negative neurological ROS  negative psych ROS   GI/Hepatic negative GI ROS, Neg liver ROS,,,  Endo/Other  negative endocrine ROS    Renal/GU negative Renal ROS  negative genitourinary   Musculoskeletal  (+) Arthritis ,    Abdominal   Peds negative pediatric ROS (+)  Hematology  (+) Blood dyscrasia, anemia   Anesthesia Other Findings   Reproductive/Obstetrics negative OB ROS                             Anesthesia Physical Anesthesia Plan  ASA: 2  Anesthesia Plan: General   Post-op Pain Management: Minimal or no pain anticipated   Induction: Intravenous  PONV Risk Score and Plan: 3 and Ondansetron, Dexamethasone and Treatment may vary due to age or medical condition  Airway Management Planned: LMA  Additional Equipment: None  Intra-op Plan:   Post-operative Plan: Extubation in OR  Informed Consent: I have reviewed the patients History and Physical, chart, labs and discussed the procedure including the risks, benefits and alternatives for the proposed anesthesia with the patient or authorized representative who has indicated his/her understanding and acceptance.       Plan Discussed with: CRNA and Anesthesiologist  Anesthesia Plan Comments: ( )        Anesthesia Quick Evaluation

## 2023-07-21 NOTE — Transfer of Care (Signed)
Immediate Anesthesia Transfer of Care Note  Patient: Raven Phillips  Procedure(s) Performed: DILATATION & CURETTAGE/HYSTEROSCOPY/Myomectomy WITH MYOSURE (Vagina )  Patient Location: PACU  Anesthesia Type:General  Level of Consciousness: awake, alert , oriented, and patient cooperative  Airway & Oxygen Therapy: Patient Spontanous Breathing  Post-op Assessment: Report given to RN and Post -op Vital signs reviewed and stable Pt with HR 130s; Dr. Nance Pew notified. IV Esmolol admin. HR now 100 bpm. PACU RN to call MD with changes.  Post vital signs: Reviewed and stable  Last Vitals:  Vitals Value Taken Time  BP 133/75 07/21/23 1630  Temp 36.4 C 07/21/23 1623  Pulse 114 07/21/23 1630  Resp 11 07/21/23 1630  SpO2 100 % 07/21/23 1630  Vitals shown include unfiled device data.  Last Pain:  Vitals:   07/21/23 1406  TempSrc: Oral  PainSc: 4       Patients Stated Pain Goal: 4 (07/21/23 1406)  Complications: No notable events documented.

## 2023-07-22 ENCOUNTER — Encounter (HOSPITAL_BASED_OUTPATIENT_CLINIC_OR_DEPARTMENT_OTHER): Payer: Self-pay | Admitting: Obstetrics & Gynecology

## 2023-07-23 LAB — SURGICAL PATHOLOGY
